# Patient Record
Sex: Male | Born: 1945
Health system: Southern US, Community
[De-identification: ages and names within clinical notes are randomized; demographics above are authoritative.]

## PROBLEM LIST (undated history)

## (undated) DIAGNOSIS — I10 Essential (primary) hypertension: Secondary | ICD-10-CM

## (undated) DIAGNOSIS — R001 Bradycardia, unspecified: Secondary | ICD-10-CM

## (undated) DIAGNOSIS — N189 Chronic kidney disease, unspecified: Secondary | ICD-10-CM

## (undated) HISTORY — PX: COLONOSCOPY: SHX174

## (undated) HISTORY — PX: HERNIA REPAIR: SHX51

---

## 2004-09-23 ENCOUNTER — Ambulatory Visit: Payer: Self-pay

## 2014-09-11 DIAGNOSIS — N183 Chronic kidney disease, stage 3 unspecified: Secondary | ICD-10-CM | POA: Insufficient documentation

## 2014-09-11 DIAGNOSIS — I10 Essential (primary) hypertension: Secondary | ICD-10-CM | POA: Insufficient documentation

## 2014-09-11 DIAGNOSIS — E669 Obesity, unspecified: Secondary | ICD-10-CM | POA: Insufficient documentation

## 2014-10-14 DIAGNOSIS — F5221 Male erectile disorder: Secondary | ICD-10-CM | POA: Insufficient documentation

## 2014-10-14 DIAGNOSIS — I10 Essential (primary) hypertension: Secondary | ICD-10-CM | POA: Insufficient documentation

## 2014-10-14 DIAGNOSIS — N289 Disorder of kidney and ureter, unspecified: Secondary | ICD-10-CM | POA: Insufficient documentation

## 2015-11-26 DIAGNOSIS — R200 Anesthesia of skin: Secondary | ICD-10-CM | POA: Insufficient documentation

## 2016-01-20 ENCOUNTER — Encounter: Admission: RE | Payer: Self-pay | Source: Ambulatory Visit

## 2016-01-20 ENCOUNTER — Ambulatory Visit: Admission: RE | Admit: 2016-01-20 | Payer: Self-pay | Source: Ambulatory Visit | Admitting: Gastroenterology

## 2016-01-20 SURGERY — COLONOSCOPY WITH PROPOFOL
Anesthesia: General

## 2016-12-28 ENCOUNTER — Encounter
Admission: RE | Disposition: A | Discharge status: Home / Self Care | Payer: Self-pay | Source: Ambulatory Visit | Attending: Gastroenterology

## 2016-12-28 ENCOUNTER — Ambulatory Visit
Admission: RE | Admit: 2016-12-28 | Discharge: 2016-12-28 | Disposition: A | Discharge status: Home / Self Care | Payer: Medicare Other | Source: Ambulatory Visit | Attending: Gastroenterology | Admitting: Gastroenterology

## 2016-12-28 ENCOUNTER — Ambulatory Visit: Payer: Medicare Other | Admitting: Anesthesiology

## 2016-12-28 ENCOUNTER — Encounter: Payer: Self-pay | Admitting: Anesthesiology

## 2016-12-28 DIAGNOSIS — K5730 Diverticulosis of large intestine without perforation or abscess without bleeding: Secondary | ICD-10-CM | POA: Insufficient documentation

## 2016-12-28 DIAGNOSIS — D631 Anemia in chronic kidney disease: Secondary | ICD-10-CM | POA: Insufficient documentation

## 2016-12-28 DIAGNOSIS — Z87891 Personal history of nicotine dependence: Secondary | ICD-10-CM | POA: Insufficient documentation

## 2016-12-28 DIAGNOSIS — K3189 Other diseases of stomach and duodenum: Secondary | ICD-10-CM | POA: Insufficient documentation

## 2016-12-28 DIAGNOSIS — D509 Iron deficiency anemia, unspecified: Secondary | ICD-10-CM | POA: Diagnosis not present

## 2016-12-28 DIAGNOSIS — K648 Other hemorrhoids: Secondary | ICD-10-CM | POA: Insufficient documentation

## 2016-12-28 DIAGNOSIS — K2960 Other gastritis without bleeding: Secondary | ICD-10-CM | POA: Diagnosis not present

## 2016-12-28 DIAGNOSIS — Z1211 Encounter for screening for malignant neoplasm of colon: Secondary | ICD-10-CM | POA: Insufficient documentation

## 2016-12-28 DIAGNOSIS — I129 Hypertensive chronic kidney disease with stage 1 through stage 4 chronic kidney disease, or unspecified chronic kidney disease: Secondary | ICD-10-CM | POA: Insufficient documentation

## 2016-12-28 DIAGNOSIS — K621 Rectal polyp: Secondary | ICD-10-CM | POA: Insufficient documentation

## 2016-12-28 DIAGNOSIS — D123 Benign neoplasm of transverse colon: Secondary | ICD-10-CM | POA: Diagnosis not present

## 2016-12-28 DIAGNOSIS — K228 Other specified diseases of esophagus: Secondary | ICD-10-CM | POA: Insufficient documentation

## 2016-12-28 DIAGNOSIS — K449 Diaphragmatic hernia without obstruction or gangrene: Secondary | ICD-10-CM | POA: Insufficient documentation

## 2016-12-28 DIAGNOSIS — N189 Chronic kidney disease, unspecified: Secondary | ICD-10-CM | POA: Diagnosis not present

## 2016-12-28 DIAGNOSIS — Z7982 Long term (current) use of aspirin: Secondary | ICD-10-CM | POA: Diagnosis not present

## 2016-12-28 DIAGNOSIS — K766 Portal hypertension: Secondary | ICD-10-CM | POA: Diagnosis not present

## 2016-12-28 HISTORY — DX: Bradycardia, unspecified: R00.1

## 2016-12-28 HISTORY — PX: ESOPHAGOGASTRODUODENOSCOPY (EGD) WITH PROPOFOL: SHX5813

## 2016-12-28 HISTORY — DX: Chronic kidney disease, unspecified: N18.9

## 2016-12-28 HISTORY — DX: Essential (primary) hypertension: I10

## 2016-12-28 HISTORY — PX: COLONOSCOPY: SHX5424

## 2016-12-28 LAB — CBC WITH DIFFERENTIAL/PLATELET
BASOS PCT: 1 %
Basophils Absolute: 0 10*3/uL (ref 0–0.1)
EOS ABS: 0 10*3/uL (ref 0–0.7)
EOS PCT: 1 %
HCT: 32.1 % — ABNORMAL LOW (ref 40.0–52.0)
Hemoglobin: 11 g/dL — ABNORMAL LOW (ref 13.0–18.0)
LYMPHS ABS: 1.2 10*3/uL (ref 1.0–3.6)
Lymphocytes Relative: 36 %
MCH: 34.3 pg — AB (ref 26.0–34.0)
MCHC: 34.4 g/dL (ref 32.0–36.0)
MCV: 99.8 fL (ref 80.0–100.0)
Monocytes Absolute: 0.3 10*3/uL (ref 0.2–1.0)
Monocytes Relative: 9 %
Neutro Abs: 1.8 10*3/uL (ref 1.4–6.5)
Neutrophils Relative %: 53 %
PLATELETS: 168 10*3/uL (ref 150–440)
RBC: 3.22 MIL/uL — AB (ref 4.40–5.90)
RDW: 14.3 % (ref 11.5–14.5)
WBC: 3.4 10*3/uL — AB (ref 3.8–10.6)

## 2016-12-28 LAB — PROTIME-INR
INR: 1.02
PROTHROMBIN TIME: 13.4 s (ref 11.4–15.2)

## 2016-12-28 SURGERY — COLONOSCOPY
Anesthesia: General

## 2016-12-28 MED ORDER — MIDAZOLAM HCL 5 MG/5ML IJ SOLN
INTRAMUSCULAR | Status: DC | PRN
  Administered 2016-12-28: 1 mg via INTRAVENOUS

## 2016-12-28 MED ORDER — LIDOCAINE HCL (PF) 2 % IJ SOLN
INTRAMUSCULAR | Status: AC
  Filled 2016-12-28: qty 2

## 2016-12-28 MED ORDER — PHENYLEPHRINE HCL 10 MG/ML IJ SOLN
INTRAMUSCULAR | Status: AC
  Filled 2016-12-28: qty 1

## 2016-12-28 MED ORDER — EPHEDRINE SULFATE 50 MG/ML IJ SOLN
INTRAMUSCULAR | Status: AC
  Filled 2016-12-28: qty 1

## 2016-12-28 MED ORDER — PROPOFOL 500 MG/50ML IV EMUL
INTRAVENOUS | Status: DC | PRN
  Administered 2016-12-28: 140 ug/kg/min via INTRAVENOUS

## 2016-12-28 MED ORDER — FENTANYL CITRATE (PF) 100 MCG/2ML IJ SOLN
INTRAMUSCULAR | Status: DC | PRN
Start: 2016-12-28 — End: 2016-12-28
  Administered 2016-12-28: 50 ug via INTRAVENOUS

## 2016-12-28 MED ORDER — PHENYLEPHRINE HCL 10 MG/ML IJ SOLN
INTRAMUSCULAR | Status: DC | PRN
  Administered 2016-12-28 (×2): 100 ug via INTRAVENOUS

## 2016-12-28 MED ORDER — PROPOFOL 10 MG/ML IV BOLUS
INTRAVENOUS | Status: DC | PRN
  Administered 2016-12-28: 100 mg via INTRAVENOUS

## 2016-12-28 MED ORDER — GLYCOPYRROLATE 0.2 MG/ML IJ SOLN
INTRAMUSCULAR | Status: DC | PRN
  Administered 2016-12-28: 0.2 mg via INTRAVENOUS

## 2016-12-28 MED ORDER — SODIUM CHLORIDE 0.9 % IV SOLN: INTRAVENOUS | Status: DC

## 2016-12-28 MED ORDER — PROPOFOL 10 MG/ML IV BOLUS
INTRAVENOUS | Status: AC
  Filled 2016-12-28: qty 20

## 2016-12-28 MED ORDER — LIDOCAINE 2% (20 MG/ML) 5 ML SYRINGE
INTRAMUSCULAR | Status: DC | PRN
  Administered 2016-12-28: 40 mg via INTRAVENOUS

## 2016-12-28 MED ORDER — FENTANYL CITRATE (PF) 100 MCG/2ML IJ SOLN
INTRAMUSCULAR | Status: AC
  Filled 2016-12-28: qty 2

## 2016-12-28 MED ORDER — PROPOFOL 500 MG/50ML IV EMUL
INTRAVENOUS | Status: AC
  Filled 2016-12-28: qty 50

## 2016-12-28 MED ORDER — SODIUM CHLORIDE 0.9 % IJ SOLN
INTRAMUSCULAR | Status: AC
  Filled 2016-12-28: qty 10

## 2016-12-28 MED ORDER — GLYCOPYRROLATE 0.2 MG/ML IJ SOLN
INTRAMUSCULAR | Status: AC
  Filled 2016-12-28: qty 1

## 2016-12-28 MED ORDER — MIDAZOLAM HCL 2 MG/2ML IJ SOLN
INTRAMUSCULAR | Status: AC
  Filled 2016-12-28: qty 2

## 2016-12-28 MED ORDER — EPHEDRINE SULFATE 50 MG/ML IJ SOLN
INTRAMUSCULAR | Status: DC | PRN
  Administered 2016-12-28: 10 mg via INTRAVENOUS

## 2016-12-28 MED ORDER — SODIUM CHLORIDE 0.9 % IV SOLN
INTRAVENOUS | Status: DC
  Administered 2016-12-28: 09:00:00 via INTRAVENOUS
  Administered 2016-12-28: 1000 mL via INTRAVENOUS

## 2016-12-28 NOTE — Op Note (Signed)
[facility] Gastroenterology Patient Name: Antonio Vance Procedure Date: 12/28/2016 8:45 AM MRN: [id number] Account #: [id number] Date of Birth: 22-Jul-1945 Admit Type: Outpatient Age: 71 Room: [facility] ENDO ROOM 3 Gender: Male Note Status: Finalized Procedure:            Colonoscopy Indications:          Screening for colorectal malignant neoplasm Providers:            Lollie Sails, MD Referring MD:         [name], MD (Referring MD) Medicines:            Monitored Anesthesia Care Complications:        No immediate complications. Procedure:            Pre-Anesthesia Assessment:                       - ASA Grade Assessment: III - A patient with severe                        systemic disease.                       After obtaining informed consent, the colonoscope was                        passed under direct vision. Throughout the procedure,                        the patient's blood pressure, pulse, and oxygen                        saturations were monitored continuously. The                        Colonoscope was introduced through the anus and                        advanced to the the cecum, identified by appendiceal                        orifice and ileocecal valve. The colonoscopy was                        performed without difficulty. The patient tolerated the                        procedure well. The quality of the bowel preparation                        was fair. Findings:      Multiple small-mouthed diverticula were found in the sigmoid colon and       descending colon.      A 3 mm polyp was found in the hepatic flexure. The polyp was flat. The       polyp was removed with a cold biopsy forceps. Resection and retrieval       were complete.      A 2 mm polyp was found in the rectum. The polyp was sessile. The polyp       was removed with a cold biopsy forceps. Resection and retrieval were  complete.      Non-bleeding internal  hemorrhoids were found during retroflexion. The       hemorrhoids were small and Grade I (internal hemorrhoids that do not       prolapse).      No additional abnormalities were found on retroflexion. Impression:           - Preparation of the colon was fair.                       - Diverticulosis in the sigmoid colon and in the                        descending colon.                       - One 3 mm polyp at the hepatic flexure, removed with a                        cold biopsy forceps. Resected and retrieved.                       - One 2 mm polyp in the rectum, removed with a cold                        biopsy forceps. Resected and retrieved.                       - Non-bleeding internal hemorrhoids. Recommendation:       - Discharge patient to home.                       - Telephone GI clinic for pathology results in 1 week.                       - Return to GI clinic in 4 weeks. Procedure Code(s):    --- Professional ---                       [phone], Colonoscopy, flexible; with biopsy, single or                        multiple Diagnosis Code(s):    --- Professional ---                       Z12.11, Encounter for screening for malignant neoplasm                        of colon                       K64.0, First degree hemorrhoids                       D12.3, Benign neoplasm of transverse colon (hepatic                        flexure or splenic flexure)                       K62.1, Rectal polyp  K57.30, Diverticulosis of large intestine without                        perforation or abscess without bleeding CPT copyright 2016 American Medical Association. All rights reserved. The codes documented in this report are preliminary and upon coder review may  be revised to meet current compliance requirements. Lollie Sails, MD 12/28/2016 9:28:56 AM This report has been signed electronically. Number of Addenda: 0 Note Initiated On: 12/28/2016 8:45 AM Scope  Withdrawal Time: 0 hours 9 minutes 14 seconds  Total Procedure Duration: 0 hours 16 minutes 6 seconds       [facility]

## 2016-12-28 NOTE — Anesthesia Preprocedure Evaluation (Signed)
Anesthesia Evaluation  Patient identified by MRN, date of birth, ID band Patient awake    Reviewed: Allergy & Precautions, H&P , NPO status , Patient's Chart, lab work & pertinent test results  History of Anesthesia Complications Negative for: history of anesthetic complications  Airway Mallampati: III  TM Distance: >3 FB Neck ROM: limited    Dental  (+) Poor Dentition, Missing, Upper Dentures, Lower Dentures   Pulmonary neg shortness of breath, former smoker,           Cardiovascular Exercise Tolerance: Good hypertension, + dysrhythmias      Neuro/Psych negative neurological ROS  negative psych ROS   GI/Hepatic negative GI ROS, Neg liver ROS,   Endo/Other  negative endocrine ROS  Renal/GU Renal disease  negative genitourinary   Musculoskeletal   Abdominal   Peds  Hematology negative hematology ROS (+)   Anesthesia Other Findings Past Medical History: No date: Bradycardia No date: Chronic kidney disease No date: Hypertension  Past Surgical History: No date: COLONOSCOPY No date: HERNIA REPAIR     Reproductive/Obstetrics negative OB ROS                             Anesthesia Physical Anesthesia Plan  ASA: III  Anesthesia Plan: General   Post-op Pain Management:    Induction: Intravenous  PONV Risk Score and Plan: 2 and Ondansetron and Dexamethasone  Airway Management Planned: Natural Airway and Nasal Cannula  Additional Equipment:   Intra-op Plan:   Post-operative Plan:   Informed Consent: I have reviewed the patients History and Physical, chart, labs and discussed the procedure including the risks, benefits and alternatives for the proposed anesthesia with the patient or authorized representative who has indicated his/her understanding and acceptance.   Dental Advisory Given  Plan Discussed with: Anesthesiologist, CRNA and Surgeon  Anesthesia Plan Comments:  (Patient consented for risks of anesthesia including but not limited to:  - adverse reactions to medications - damage to teeth, lips or other oral mucosa - sore throat or hoarseness - Damage to heart, brain, lungs or loss of life  Patient voiced understanding.)        Anesthesia Quick Evaluation

## 2016-12-28 NOTE — Anesthesia Post-op Follow-up Note (Cosign Needed)
Anesthesia QCDR form completed.        

## 2016-12-28 NOTE — Op Note (Signed)
[facility] Gastroenterology Patient Name: Antonio Vance Procedure Date: 12/28/2016 8:46 AM MRN: [id number] Account #: [id number] Date of Birth: 01/01/1946 Admit Type: Outpatient Age: 71 Room: [facility] ENDO ROOM 3 Gender: Male Note Status: Finalized Procedure:            Upper GI endoscopy Indications:          Iron deficiency anemia Providers:            Lollie Sails, MD Referring MD:         [name], MD (Referring MD) Medicines:            Monitored Anesthesia Care Complications:        No immediate complications. Procedure:            Pre-Anesthesia Assessment:                       - ASA Grade Assessment: III - A patient with severe                        systemic disease.                       After obtaining informed consent, the endoscope was                        passed under direct vision. Throughout the procedure,                        the patient's blood pressure, pulse, and oxygen                        saturations were monitored continuously. The Endoscope                        was introduced through the mouth, and advanced to the                        third part of duodenum. The upper GI endoscopy was                        accomplished without difficulty. The patient tolerated                        the procedure well. Findings:      The Z-line was irregular. Biopsies were taken with a cold forceps for       histology.      The exam of the esophagus was otherwise normal.      A small hiatal hernia was present.      Patchy moderate inflammation characterized by congestion (edema),       erosions and erythema was found in the gastric antrum. Biopsies were       taken with a cold forceps for histology. Biopsies were taken with a cold       forceps for Helicobacter pylori testing. Biopsies were taken with a cold       forceps for histology. Biopsies were taken with a cold forceps for       Helicobacter pylori testing.      Diffuse and  patchy mild inflammation characterized by congestion (edema)       and erythema was found in the gastric  body.      Mild portal hypertensive gastropathy was found in the gastric body.      no evidence of esophageal varices      The examined duodenum was normal. Impression:           - Z-line irregular. Biopsied.                       - Small hiatal hernia.                       - Erosive gastritis. Biopsied.                       - Gastritis.                       - Portal hypertensive gastropathy.                       - Normal examined duodenum. Recommendation:       - Await pathology results.                       - Use sucralfate tablets 1 gram PO BID daily.                       - Use Protonix (pantoprazole) 40 mg PO daily daily. Procedure Code(s):    --- Professional ---                       [phone], Esophagogastroduodenoscopy, flexible, transoral;                        with biopsy, single or multiple Diagnosis Code(s):    --- Professional ---                       K22.8, Other specified diseases of esophagus                       K44.9, Diaphragmatic hernia without obstruction or                        gangrene                       K29.60, Other gastritis without bleeding                       K29.70, Gastritis, unspecified, without bleeding                       K76.6, Portal hypertension                       K31.89, Other diseases of stomach and duodenum                       D50.9, Iron deficiency anemia, unspecified CPT copyright 2016 American Medical Association. All rights reserved. The codes documented in this report are preliminary and upon coder review may  be revised to meet current compliance requirements. Lollie Sails, MD 12/28/2016 9:06:38 AM This report has been signed electronically. Number of Addenda: 0 Note Initiated On: 12/28/2016 8:46 AM      [facility]

## 2016-12-28 NOTE — Transfer of Care (Signed)
Immediate Anesthesia Transfer of Care Note  Patient: Antonio Vance  Procedure(s) Performed: Procedure(s): COLONOSCOPY (N/A) ESOPHAGOGASTRODUODENOSCOPY (EGD) WITH PROPOFOL (N/A)  Patient Location: PACU and Endoscopy Unit  Anesthesia Type:General  Level of Consciousness: sedated  Airway & Oxygen Therapy: Patient Spontanous Breathing and Patient connected to nasal cannula oxygen  Post-op Assessment: Report given to RN and Post -op Vital signs reviewed and stable  Post vital signs: Reviewed and stable  Last Vitals:  Vitals:   12/28/16 0715  BP: (!) 156/54  Pulse: (!) 40  Resp: 16  Temp: 36.3 C    Last Pain:  Vitals:   12/28/16 0715  TempSrc: Tympanic         Complications: No apparent anesthesia complications

## 2016-12-28 NOTE — H&P (Signed)
Outpatient short stay form Pre-procedure 12/28/2016 7:55 AM Lollie Sails MD  Primary Physician: Dr. Delight Stare  Reason for visit:  EGD and colonoscopy  History of present illness:  Patient is a 71 year old male presenting today as above. He has a history of macrocytic anemia. He apparently has had a colonoscopy in the past but does not remember it. In evaluation of his anemia there seems to be a mixed etiology with contributions from chronic kidney disease, possibly B12 or folate deficiency, as well as alcohol use. Patient also does take NSAIDs.    Current Facility-Administered Medications:  .  0.9 %  sodium chloride infusion, , Intravenous, Continuous, Lollie Sails, MD, Last Rate: 20 mL/hr at 12/28/16 0733, 1,000 mL at 12/28/16 0733 .  0.9 %  sodium chloride infusion, , Intravenous, Continuous, Lollie Sails, MD  Prescriptions Prior to Admission  Medication Sig Dispense Refill Last Dose  . amlodipine-atorvastatin (CADUET) 10-10 MG tablet Take 1 tablet by mouth daily.   12/27/2016 at Unknown time  . lisinopril (PRINIVIL,ZESTRIL) 10 MG tablet Take 10 mg by mouth daily.   12/27/2016 at Unknown time  . Multiple Vitamin (MULTIVITAMIN) tablet Take 1 tablet by mouth daily.   Past Week at Unknown time  . naproxen sodium (ANAPROX) 220 MG tablet Take 220 mg by mouth daily as needed.     Marland Kitchen aspirin 81 MG chewable tablet Chew by mouth daily.   Not Taking at Unknown time     No Known Allergies   Past Medical History:  Diagnosis Date  . Bradycardia   . Chronic kidney disease   . Hypertension     Review of systems:      Physical Exam    Heart and lungs: Irregularly irregular    HEENT: Normocephalic atraumatic eyes are anicteric    Other:     Pertinant exam for procedure: Soft nontender nondistended bowel sounds positive normoactive.    Planned proceedures: EGD, colonoscopy and indicated procedures. I have discussed the risks benefits and complications of procedures  to include not limited to bleeding, infection, perforation and the risk of sedation and the patient wishes to proceed.    Lollie Sails, MD Gastroenterology 12/28/2016  7:55 AM

## 2016-12-29 ENCOUNTER — Encounter: Payer: Self-pay | Admitting: Gastroenterology

## 2016-12-29 LAB — SURGICAL PATHOLOGY

## 2016-12-29 NOTE — Anesthesia Postprocedure Evaluation (Signed)
Anesthesia Post Note  Patient: Antonio Vance  Procedure(s) Performed: Procedure(s) (LRB): COLONOSCOPY (N/A) ESOPHAGOGASTRODUODENOSCOPY (EGD) WITH PROPOFOL (N/A)  Patient location during evaluation: Endoscopy Anesthesia Type: General Level of consciousness: awake and alert Pain management: pain level controlled Vital Signs Assessment: post-procedure vital signs reviewed and stable Respiratory status: spontaneous breathing, nonlabored ventilation, respiratory function stable and patient connected to nasal cannula oxygen Cardiovascular status: blood pressure returned to baseline and stable Postop Assessment: no signs of nausea or vomiting Anesthetic complications: no     Last Vitals:  Vitals:   12/28/16 0950 12/28/16 1000  BP: 129/86 (!) 154/77  Pulse: 64 (!) 36  Resp: 15 20  Temp:      Last Pain:  Vitals:   12/29/16 0752  TempSrc:   PainSc: 0-No pain                 Precious Haws Demauri Advincula

## 2017-06-24 ENCOUNTER — Ambulatory Visit: Payer: Self-pay | Admitting: Urology

## 2017-07-22 ENCOUNTER — Encounter: Payer: Self-pay | Admitting: Urology

## 2017-07-22 ENCOUNTER — Other Ambulatory Visit
Admission: RE | Admit: 2017-07-22 | Discharge: 2017-07-22 | Disposition: A | Discharge status: Home / Self Care | Payer: Medicare Other | Source: Ambulatory Visit | Attending: Urology | Admitting: Urology

## 2017-07-22 ENCOUNTER — Ambulatory Visit (INDEPENDENT_AMBULATORY_CARE_PROVIDER_SITE_OTHER): Payer: Medicare Other | Admitting: Urology

## 2017-07-22 VITALS — BP 148/57 | HR 40 | Ht 69.0 in | Wt 215.0 lb

## 2017-07-22 DIAGNOSIS — R972 Elevated prostate specific antigen [PSA]: Secondary | ICD-10-CM | POA: Diagnosis not present

## 2017-07-22 DIAGNOSIS — C61 Malignant neoplasm of prostate: Secondary | ICD-10-CM | POA: Diagnosis present

## 2017-07-22 NOTE — Progress Notes (Signed)
07/22/2017 3:26 PM   Antonio Vance 01-02-1946 [id number]  Referring provider: [name], [address] [place], [name] [address]  Chief Complaint  Patient presents with  . Elevated PSA    New Patient    HPI: 72 year old male referred for further evaluation of elevated PSA.  He underwent routine by his PCP at [facility].  This revealed an elevated PSA to 5.5 in 05/2017.  No other PSAs were sent for review.  He does not recall ever being told he had elevated PSA in the past.  He has not had any recent rectal exams.  He has no family history of prostate cancer.  He denies any significant urinary symptoms including no hesitancy, weak stream, incomplete bladder emptying.  He has no frequency or urgency.  No UTIs.  He gets up 1-2 times at night but this is not bothersome to him.  He is not on any BPH medications.  No weight loss or bone pain.   PMH: Past Medical History:  Diagnosis Date  . Bradycardia   . Chronic kidney disease   . Hypertension     Surgical History: Past Surgical History:  Procedure Laterality Date  . COLONOSCOPY    . COLONOSCOPY N/A 12/28/2016   Procedure: COLONOSCOPY;  Surgeon: Lollie Sails, MD;  Location: [facility] ENDOSCOPY;  Service: Endoscopy;  Laterality: N/A;  . ESOPHAGOGASTRODUODENOSCOPY (EGD) WITH PROPOFOL N/A 12/28/2016   Procedure: ESOPHAGOGASTRODUODENOSCOPY (EGD) WITH PROPOFOL;  Surgeon: Lollie Sails, MD;  Location: [facility] ENDOSCOPY;  Service: Endoscopy;  Laterality: N/A;  . HERNIA REPAIR      Home Medications:  Allergies as of 07/22/2017   No Known Allergies     Medication List        Accurate as of 07/22/17  3:26 PM. Always use your most recent med list.          amLODipine 10 MG tablet Commonly known as:  NORVASC Take by mouth.   aspirin 81 MG chewable tablet Chew by mouth daily.   lisinopril 10 MG tablet Commonly known as:  PRINIVIL,ZESTRIL Take 10 mg by mouth daily.   multivitamin tablet Take 1  tablet by mouth daily.       Allergies: No Known Allergies  Family History: Family History  Problem Relation Age of Onset  . Hypertension Mother   . Cancer Mother   . Hypertension Father   . Diabetes Brother   . Hypertension Brother     Social History:  reports that he has quit smoking. he has never used smokeless tobacco. He reports that he does not drink alcohol or use drugs.  ROS: UROLOGY Frequent Urination?: No Hard to postpone urination?: No Burning/pain with urination?: No Get up at night to urinate?: No Leakage of urine?: No Urine stream starts and stops?: No Trouble starting stream?: No Do you have to strain to urinate?: No Blood in urine?: No Urinary tract infection?: No Sexually transmitted disease?: No Injury to kidneys or bladder?: No Painful intercourse?: No Weak stream?: No Erection problems?: No Penile pain?: No  Gastrointestinal Nausea?: No Vomiting?: No Indigestion/heartburn?: No Diarrhea?: No Constipation?: No  Constitutional Fever: No Night sweats?: No Weight loss?: No Fatigue?: No  Skin Skin rash/lesions?: No Itching?: No  Eyes Blurred vision?: No Double vision?: No  Ears/Nose/Throat Sore throat?: No Sinus problems?: No  Hematologic/Lymphatic Swollen glands?: No Easy bruising?: No  Cardiovascular Leg swelling?: No Chest pain?: No  Respiratory Cough?: No Shortness of breath?: No  Endocrine Excessive thirst?: No  Musculoskeletal Back pain?: No Joint pain?: Yes  Neurological Headaches?: No Dizziness?: No  Psychologic Depression?: No Anxiety?: No  Physical Exam: BP (!) 148/57   Pulse (!) 40   Ht 5\' 9"  (1.753 m)   Wt 215 lb (97.5 kg)   BMI 31.75 kg/m   Constitutional:  Alert and oriented, No acute distress.  Pleasant.  Accompanied by daughter today. HEENT: [place] AT, moist mucus membranes.  Trachea midline, no masses. Cardiovascular: No clubbing, cyanosis, or edema. Respiratory: Normal respiratory effort,  no increased work of breathing. GI: Abdomen is soft, nontender, nondistended, no abdominal masses GU: No CVA tenderness.  DRE: Normal sphincter tone.  Enlarged 50+ cc prostate, nontender, nondistedned.   Skin: No rashes, bruises or suspicious lesions. Neurologic: Grossly intact, no focal deficits, moving all 4 extremities. Psychiatric: Normal mood and affect.  Laboratory Data: Lab Results  Component Value Date   WBC 3.4 (L) 12/28/2016   HGB 11.0 (L) 12/28/2016   HCT 32.1 (L) 12/28/2016   MCV 99.8 12/28/2016   PLT 168 12/28/2016    Creatinine 1.87.   PSA 5.5.  On 05/2017  Urinalysis N/a   Pertinent Imaging: N/A  Assessment & Plan:    1. Prostate cancer [facility])  We reviewed the implications of an elevated PSA and the uncertainty surrounding it. In general, a man's PSA increases with age and is produced by both normal and cancerous prostate tissue. Differential for elevated PSA is BPH, prostate cancer, infection, recent intercourse/ejaculation, prostate infarction, recent urethroscopic manipulation (foley placement/cystoscopy) and prostatitis. Management of an elevated PSA can include observation or prostate biopsy and wediscussed this in detail.  We discussed that indications for prostate biopsy are defined by age and race specific PSA cutoffs as well as a PSA velocity of 0.75/year.  We reached out to [facility] clinic clinic today.  Unfortunately, their office is closed this afternoon and were unable to obtain previous PSAs for review.  I explained without knowing what his previous PSAs have been, it is difficult to assess whether or not this is an acute elevation or perhaps his baseline.  Based on his exam today, he does have an enlarged prostate without significant urinary symptoms and thus his PSA density may be appropriate.  PSA was repeated today.  If his PSA is stable or more elevated, will repeat in 4 months.  If his PSA goes down to normal, will follow him next year with  a repeat PSA DRE.  Both he and his daughter are agreeable to this plan.  His daughter is a Marine scientist.  She would like to be the primary contact for all medical communications.  We will also attempt to get his previous PSA data from [facility] clinic on Monday.  - PSA; Future  Return in about 4 months (around 11/19/2017) for PSA.  Hollice Espy, MD  [facility] [address] [place], [place] [address] [phone]  * Call daughter with PSA results as primary contact

## 2017-07-23 LAB — PSA: PROSTATIC SPECIFIC ANTIGEN: 6.44 ng/mL — AB (ref 0.00–4.00)

## 2017-07-25 ENCOUNTER — Telehealth: Payer: Self-pay | Admitting: Urology

## 2017-07-25 NOTE — Telephone Encounter (Signed)
Called and the office is closed for [place] will call tomorrow to get his labs  [place]

## 2017-07-25 NOTE — Telephone Encounter (Signed)
-----   Message from Hollice Espy, MD sent at 07/22/2017  5:42 PM EST ----- Could you please request additional PSA records from [facility] clinic?  Will be called on Friday afternoon, they were closed.

## 2017-07-26 ENCOUNTER — Other Ambulatory Visit: Payer: Self-pay

## 2017-08-15 ENCOUNTER — Telehealth: Payer: Self-pay | Admitting: Urology

## 2017-08-15 NOTE — Telephone Encounter (Signed)
Patient's previous PSA results were received from [facility].  The results were 5.5 on 05/06/2017.  Patient's most recent PSA in our office were 6.44 on 07/22/2017.  Patient's daughter, Ortencia Kick, called the office today inquiring if a follow up on 11/25/2017 is still appropriate based on his increasing PSA.  She can be reached at [phone]

## 2017-08-16 NOTE — Telephone Encounter (Signed)
Spoke with Anderson Malta in reference to pt f/u in May and PSA results from [facility]. Anderson Malta voiced understanding of whole conversation.

## 2017-08-16 NOTE — Telephone Encounter (Signed)
Yes, lets keep f/u in 11/2017 with PSA a few days prior to the visit.    At the time of consult in 07/2017, I had these values and was looking for additional values which are not available.    Lets keep our f/u as scheduled.  I would like to get a least one more data point before we make decision to biopsy.    Hollice Espy, MD

## 2017-08-24 ENCOUNTER — Other Ambulatory Visit: Payer: Self-pay | Admitting: Family Medicine

## 2017-08-24 DIAGNOSIS — R413 Other amnesia: Secondary | ICD-10-CM

## 2017-09-02 ENCOUNTER — Ambulatory Visit: Payer: Medicare Other

## 2017-11-15 ENCOUNTER — Other Ambulatory Visit: Payer: Self-pay

## 2017-11-24 ENCOUNTER — Other Ambulatory Visit: Payer: Self-pay

## 2017-11-24 DIAGNOSIS — R972 Elevated prostate specific antigen [PSA]: Secondary | ICD-10-CM

## 2017-11-25 ENCOUNTER — Encounter: Payer: Self-pay | Admitting: Urology

## 2017-11-25 ENCOUNTER — Ambulatory Visit: Payer: Medicare Other | Admitting: Urology

## 2017-11-25 ENCOUNTER — Ambulatory Visit (INDEPENDENT_AMBULATORY_CARE_PROVIDER_SITE_OTHER): Payer: Medicare Other | Admitting: Urology

## 2017-11-25 VITALS — BP 133/65 | HR 40 | Resp 16 | Ht 68.5 in | Wt 209.5 lb

## 2017-11-25 DIAGNOSIS — R972 Elevated prostate specific antigen [PSA]: Secondary | ICD-10-CM | POA: Diagnosis not present

## 2017-11-29 NOTE — Progress Notes (Signed)
11/25/2017 10:26 AM   Antonio Vance 09-28-1945 [id number]  Referring provider: [name], [place], [place] [address]  Chief Complaint  Patient presents with  . Follow-up    elevated PSA    HPI: 72 year old male who returns today for follow-up of elevated PSA.  He underwent routine by his PCP at [facility] clinic.  This revealed an elevated PSA to 5.5 in 05/2017.   PSA was repeated in 07/22/2017 and noted to be rising to 6.44.  Most recently, he had a talk to his primary care physicians on 11/11/2017 at which time his PSA was 6.2.  Prior to the above, he has no previous PSAs for review.  Rectal exam in 07/2017 showed enlarged 50+ cc prostate, without nodules.  He has no family history of prostate cancer.  He denies any significant urinary symptoms including no hesitancy, weak stream, incomplete bladder emptying.  He has no frequency or urgency.  No UTIs.  He gets up 1-2 times at night but this is not bothersome to him.  He is not on any BPH medications.  His urinary symptoms today are unchanged.    PMH: Past Medical History:  Diagnosis Date  . Bradycardia   . Chronic kidney disease   . Hypertension     Surgical History: Past Surgical History:  Procedure Laterality Date  . COLONOSCOPY    . COLONOSCOPY N/A 12/28/2016   Procedure: COLONOSCOPY;  Surgeon: Lollie Sails, MD;  Location: [facility] ENDOSCOPY;  Service: Endoscopy;  Laterality: N/A;  . ESOPHAGOGASTRODUODENOSCOPY (EGD) WITH PROPOFOL N/A 12/28/2016   Procedure: ESOPHAGOGASTRODUODENOSCOPY (EGD) WITH PROPOFOL;  Surgeon: Lollie Sails, MD;  Location: [facility] ENDOSCOPY;  Service: Endoscopy;  Laterality: N/A;  . HERNIA REPAIR      Home Medications:  Allergies as of 11/25/2017   No Known Allergies     Medication List        Accurate as of 11/25/17 11:59 PM. Always use your most recent med list.          amLODipine 10 MG tablet Commonly known as:  NORVASC Take by mouth.   aspirin  81 MG chewable tablet Chew by mouth daily.   b complex vitamins capsule Take by mouth.   lisinopril 10 MG tablet Commonly known as:  PRINIVIL,ZESTRIL Take 10 mg by mouth daily.   multivitamin tablet Take 1 tablet by mouth daily.       Allergies: No Known Allergies  Family History: Family History  Problem Relation Age of Onset  . Hypertension Mother   . Cancer Mother   . Hypertension Father   . Diabetes Brother   . Hypertension Brother     Social History:  reports that he has quit smoking. He has never used smokeless tobacco. He reports that he does not drink alcohol or use drugs.  ROS: UROLOGY Frequent Urination?: No Hard to postpone urination?: No Burning/pain with urination?: No Get up at night to urinate?: Yes Leakage of urine?: No Urine stream starts and stops?: No Trouble starting stream?: No Do you have to strain to urinate?: No Blood in urine?: No Urinary tract infection?: No Sexually transmitted disease?: No Injury to kidneys or bladder?: No Painful intercourse?: No Weak stream?: No Erection problems?: No Penile pain?: No  Gastrointestinal Nausea?: No Vomiting?: No Indigestion/heartburn?: No Diarrhea?: No Constipation?: No  Constitutional Fever: No Night sweats?: No Weight loss?: No Fatigue?: No  Skin Skin rash/lesions?: No Itching?: No  Eyes Blurred vision?: No Double vision?: No  Ears/Nose/Throat Sore throat?: No Sinus problems?: No  Hematologic/Lymphatic Swollen glands?: No Easy bruising?: No  Cardiovascular Leg swelling?: No Chest pain?: No  Respiratory Cough?: No Shortness of breath?: No  Endocrine Excessive thirst?: No  Musculoskeletal Back pain?: No Joint pain?: No  Neurological Headaches?: No Dizziness?: No  Psychologic Depression?: No Anxiety?: No  Physical Exam: BP 133/65   Pulse (!) 40   Resp 16   Ht 5' 8.5" (1.74 m)   Wt 209 lb 8 oz (95 kg)   SpO2 96%   BMI 31.39 kg/m   Constitutional:   Alert and oriented, No acute distress.  Accompanied by daughter today. HEENT: [place] AT, moist mucus membranes.  Trachea midline, no masses. Cardiovascular: No clubbing, cyanosis, or edema. Respiratory: Normal respiratory effort, no increased work of breathing. Skin: No rashes, bruises or suspicious lesions. Neurologic: Grossly intact, no focal deficits, moving all 4 extremities. Psychiatric: Normal mood and affect.  Laboratory Data: Lab Results  Component Value Date   WBC 3.4 (L) 12/28/2016   HGB 11.0 (L) 12/28/2016   HCT 32.1 (L) 12/28/2016   MCV 99.8 12/28/2016   PLT 168 12/28/2016   PSA trend  as above  Urinalysis N/A  Pertinent Imaging: NA  Assessment & Plan:    1. Elevated PSA History of elevated and mildly fluctuating PSA in the setting of prostamegaly As previously discussed, baseline PSA is unknown and this may in fact represent his baseline PSA which may be appropriate for the size of his gland We will continue to follow conservatively at this time given his age and comorbidities Plan for PSA/DRE in 6 months Patient and his daughter are agreeable this plan  Return in about 6 months (around 05/28/2018) for PSA/ DRE.  Hollice Espy, MD  [facility] [address] [place], [place] [address] [phone]

## 2018-04-07 ENCOUNTER — Emergency Department: Payer: Medicare Other

## 2018-04-07 ENCOUNTER — Encounter: Payer: Self-pay | Admitting: Emergency Medicine

## 2018-04-07 ENCOUNTER — Other Ambulatory Visit: Payer: Self-pay

## 2018-04-07 ENCOUNTER — Observation Stay
Admission: EM | Admit: 2018-04-07 | Discharge: 2018-04-08 | Disposition: A | Discharge status: Home / Self Care | Payer: Medicare Other | Attending: Internal Medicine | Admitting: Internal Medicine

## 2018-04-07 DIAGNOSIS — N400 Enlarged prostate without lower urinary tract symptoms: Secondary | ICD-10-CM | POA: Insufficient documentation

## 2018-04-07 DIAGNOSIS — N183 Chronic kidney disease, stage 3 (moderate): Secondary | ICD-10-CM | POA: Diagnosis not present

## 2018-04-07 DIAGNOSIS — M5136 Other intervertebral disc degeneration, lumbar region: Secondary | ICD-10-CM | POA: Diagnosis not present

## 2018-04-07 DIAGNOSIS — I1310 Hypertensive heart and chronic kidney disease without heart failure, with stage 1 through stage 4 chronic kidney disease, or unspecified chronic kidney disease: Secondary | ICD-10-CM | POA: Insufficient documentation

## 2018-04-07 DIAGNOSIS — I313 Pericardial effusion (noninflammatory): Secondary | ICD-10-CM | POA: Insufficient documentation

## 2018-04-07 DIAGNOSIS — K439 Ventral hernia without obstruction or gangrene: Secondary | ICD-10-CM | POA: Diagnosis not present

## 2018-04-07 DIAGNOSIS — I712 Thoracic aortic aneurysm, without rupture: Secondary | ICD-10-CM | POA: Diagnosis not present

## 2018-04-07 DIAGNOSIS — K5730 Diverticulosis of large intestine without perforation or abscess without bleeding: Secondary | ICD-10-CM | POA: Insufficient documentation

## 2018-04-07 DIAGNOSIS — K2970 Gastritis, unspecified, without bleeding: Principal | ICD-10-CM | POA: Insufficient documentation

## 2018-04-07 DIAGNOSIS — R7989 Other specified abnormal findings of blood chemistry: Secondary | ICD-10-CM | POA: Insufficient documentation

## 2018-04-07 DIAGNOSIS — Z7982 Long term (current) use of aspirin: Secondary | ICD-10-CM | POA: Diagnosis not present

## 2018-04-07 DIAGNOSIS — K449 Diaphragmatic hernia without obstruction or gangrene: Secondary | ICD-10-CM | POA: Diagnosis not present

## 2018-04-07 DIAGNOSIS — Z87891 Personal history of nicotine dependence: Secondary | ICD-10-CM | POA: Insufficient documentation

## 2018-04-07 DIAGNOSIS — K429 Umbilical hernia without obstruction or gangrene: Secondary | ICD-10-CM | POA: Diagnosis not present

## 2018-04-07 DIAGNOSIS — I214 Non-ST elevation (NSTEMI) myocardial infarction: Secondary | ICD-10-CM | POA: Diagnosis present

## 2018-04-07 DIAGNOSIS — R778 Other specified abnormalities of plasma proteins: Secondary | ICD-10-CM | POA: Diagnosis present

## 2018-04-07 DIAGNOSIS — Z79899 Other long term (current) drug therapy: Secondary | ICD-10-CM | POA: Insufficient documentation

## 2018-04-07 LAB — URINALYSIS, COMPLETE (UACMP) WITH MICROSCOPIC
Bacteria, UA: NONE SEEN
Bilirubin Urine: NEGATIVE
Glucose, UA: NEGATIVE mg/dL
Hgb urine dipstick: NEGATIVE
KETONES UR: NEGATIVE mg/dL
Leukocytes, UA: NEGATIVE
Nitrite: NEGATIVE
PH: 5 (ref 5.0–8.0)
Protein, ur: NEGATIVE mg/dL
SPECIFIC GRAVITY, URINE: 1.017 (ref 1.005–1.030)

## 2018-04-07 LAB — COMPREHENSIVE METABOLIC PANEL
ALBUMIN: 3.7 g/dL (ref 3.5–5.0)
ALK PHOS: 75 U/L (ref 38–126)
ALT: 11 U/L (ref 0–44)
AST: 14 U/L — AB (ref 15–41)
Anion gap: 7 (ref 5–15)
BUN: 31 mg/dL — AB (ref 8–23)
CALCIUM: 9.4 mg/dL (ref 8.9–10.3)
CHLORIDE: 111 mmol/L (ref 98–111)
CO2: 24 mmol/L (ref 22–32)
CREATININE: 1.69 mg/dL — AB (ref 0.61–1.24)
GFR calc Af Amer: 45 mL/min — ABNORMAL LOW (ref >60)
GFR calc non Af Amer: 38 mL/min — ABNORMAL LOW (ref >60)
Glucose, Bld: 94 mg/dL (ref 70–99)
Potassium: 4.4 mmol/L (ref 3.5–5.1)
SODIUM: 142 mmol/L (ref 135–145)
Total Bilirubin: 0.9 mg/dL (ref 0.3–1.2)
Total Protein: 7.7 g/dL (ref 6.5–8.1)

## 2018-04-07 LAB — LIPASE, BLOOD: Lipase: 29 U/L (ref 11–51)

## 2018-04-07 LAB — CBC
HCT: 32.7 % — ABNORMAL LOW (ref 40.0–52.0)
Hemoglobin: 11.5 g/dL — ABNORMAL LOW (ref 13.0–18.0)
MCH: 35.4 pg — AB (ref 26.0–34.0)
MCHC: 35.4 g/dL (ref 32.0–36.0)
MCV: 100.1 fL — AB (ref 80.0–100.0)
PLATELETS: 173 10*3/uL (ref 150–440)
RBC: 3.26 MIL/uL — ABNORMAL LOW (ref 4.40–5.90)
RDW: 14.2 % (ref 11.5–14.5)
WBC: 3.9 10*3/uL (ref 3.8–10.6)

## 2018-04-07 LAB — TSH: TSH: 1.982 u[IU]/mL (ref 0.350–4.500)

## 2018-04-07 LAB — TROPONIN I
Troponin I: 0.11 ng/mL (ref <0.03)
Troponin I: 0.11 ng/mL (ref <0.03)

## 2018-04-07 LAB — CK: CK TOTAL: 123 U/L (ref 49–397)

## 2018-04-07 MED ORDER — ASPIRIN EC 81 MG PO TBEC
81.0000 mg | DELAYED_RELEASE_TABLET | Freq: Every day | ORAL | Status: DC
  Administered 2018-04-07 – 2018-04-08 (×2): 81 mg via ORAL
  Filled 2018-04-07 (×2): qty 1

## 2018-04-07 MED ORDER — SODIUM CHLORIDE 0.9% FLUSH
3.0000 mL | Freq: Two times a day (BID) | INTRAVENOUS | Status: DC
  Administered 2018-04-07 – 2018-04-08 (×2): 3 mL via INTRAVENOUS

## 2018-04-07 MED ORDER — HYDROCODONE-ACETAMINOPHEN 5-325 MG PO TABS
1.0000 | ORAL_TABLET | ORAL | Status: DC | PRN
  Administered 2018-04-07: 2 via ORAL
  Filled 2018-04-07: qty 2

## 2018-04-07 MED ORDER — AMLODIPINE BESYLATE 10 MG PO TABS
10.0000 mg | ORAL_TABLET | Freq: Every day | ORAL | Status: DC
  Administered 2018-04-07 – 2018-04-08 (×2): 10 mg via ORAL
  Filled 2018-04-07 (×2): qty 1

## 2018-04-07 MED ORDER — HYDROCODONE-ACETAMINOPHEN 5-325 MG PO TABS: 1.0000 | ORAL_TABLET | ORAL | Status: DC | PRN

## 2018-04-07 MED ORDER — PANTOPRAZOLE SODIUM 40 MG PO TBEC
40.0000 mg | DELAYED_RELEASE_TABLET | Freq: Two times a day (BID) | ORAL | Status: DC
  Administered 2018-04-07 – 2018-04-08 (×2): 40 mg via ORAL
  Filled 2018-04-07 (×2): qty 1

## 2018-04-07 MED ORDER — ACETAMINOPHEN 650 MG RE SUPP: 650.0000 mg | Freq: Four times a day (QID) | RECTAL | Status: DC | PRN

## 2018-04-07 MED ORDER — ONDANSETRON HCL 4 MG PO TABS: 4.0000 mg | ORAL_TABLET | Freq: Four times a day (QID) | ORAL | Status: DC | PRN

## 2018-04-07 MED ORDER — ONDANSETRON HCL 4 MG/2ML IJ SOLN: 4.0000 mg | Freq: Four times a day (QID) | INTRAMUSCULAR | Status: DC | PRN

## 2018-04-07 MED ORDER — LISINOPRIL 10 MG PO TABS
10.0000 mg | ORAL_TABLET | Freq: Every day | ORAL | Status: DC
  Administered 2018-04-07 – 2018-04-08 (×2): 10 mg via ORAL
  Filled 2018-04-07 (×2): qty 1

## 2018-04-07 MED ORDER — ACETAMINOPHEN 325 MG PO TABS: 650.0000 mg | ORAL_TABLET | Freq: Four times a day (QID) | ORAL | Status: DC | PRN

## 2018-04-07 MED ORDER — POLYETHYLENE GLYCOL 3350 17 G PO PACK: 17.0000 g | PACK | Freq: Every day | ORAL | Status: DC | PRN

## 2018-04-07 MED ORDER — IOHEXOL 350 MG/ML SOLN
75.0000 mL | Freq: Once | INTRAVENOUS | Status: AC | PRN
  Administered 2018-04-07: 75 mL via INTRAVENOUS

## 2018-04-07 MED ORDER — ENOXAPARIN SODIUM 40 MG/0.4ML ~~LOC~~ SOLN
40.0000 mg | SUBCUTANEOUS | Status: DC
  Administered 2018-04-07: 40 mg via SUBCUTANEOUS
  Filled 2018-04-07: qty 0.4

## 2018-04-07 MED ORDER — ALBUTEROL SULFATE (2.5 MG/3ML) 0.083% IN NEBU: 2.5000 mg | INHALATION_SOLUTION | RESPIRATORY_TRACT | Status: DC | PRN

## 2018-04-07 MED ORDER — TRAMADOL HCL 50 MG PO TABS: 50.0000 mg | ORAL_TABLET | Freq: Four times a day (QID) | ORAL | Status: DC | PRN

## 2018-04-07 NOTE — ED Notes (Signed)
Pt taken to CT.

## 2018-04-07 NOTE — ED Notes (Signed)
Pt talking to family member on the phone.

## 2018-04-07 NOTE — ED Notes (Signed)
Pt ambulatory to toilet by self. No distress noted.

## 2018-04-07 NOTE — ED Notes (Addendum)
Pt states R sided abd pain that wraps around R flank when moving around, only when moving around. States pain x 1 month. Normal BM per pt. No N&V.   Alert, oriented, ambulatory. No distress noted.

## 2018-04-07 NOTE — ED Notes (Signed)
Date and time results received: 04/07/18 1253   Test: tropinin Critical Value: 0.11  Name of Provider Notified: Dr. Alfred Levins

## 2018-04-07 NOTE — ED Notes (Signed)
Pt in CT.

## 2018-04-07 NOTE — ED Notes (Signed)
Pt ambulatory to treatment room. Attempting to obtain urine sample at this time.

## 2018-04-07 NOTE — ED Provider Notes (Signed)
[facility] Emergency Department Provider Note  ____________________________________________  Time seen: Approximately 11:30 AM  I have reviewed the triage vital signs and the nursing notes.   HISTORY  Chief Complaint Abdominal Pain   HPI Antonio Vance is a 72 y.o. male with a history of chronic kidney disease and hypertension who presents for evaluation of right-sided abdominal pain.  Patient reports right lower quadrant abdominal pain radiating to the right flank which has been present for 4 weeks.  The pain resolves at rest and happens with movement of his torso.  The pain is nonreproducible on palpation.  Patient does not remember what he was doing when the pain started.  He initially saw his primary care doctor who gave him a pain pill but that did not help.  He denies constipation, diarrhea, dysuria, hematuria, chest pain, shortness of breath, fever, chills.  The pain is not pleuritic in nature.  Past Medical History:  Diagnosis Date  . Bradycardia   . Chronic kidney disease   . Hypertension     Patient Active Problem List   Diagnosis Date Noted  . Bilateral hand numbness 11/26/2015  . Kidney disease 10/14/2014  . Erectile dysfunction of nonorganic origin 10/14/2014  . Benign essential hypertension 10/14/2014  . Obesity (BMI 30-39.9) 09/11/2014  . Hypertension 09/11/2014  . CKD (chronic kidney disease) stage 3, GFR 30-59 ml/min (HCC) 09/11/2014    Past Surgical History:  Procedure Laterality Date  . COLONOSCOPY    . COLONOSCOPY N/A 12/28/2016   Procedure: COLONOSCOPY;  Surgeon: Lollie Sails, MD;  Location: [facility] ENDOSCOPY;  Service: Endoscopy;  Laterality: N/A;  . ESOPHAGOGASTRODUODENOSCOPY (EGD) WITH PROPOFOL N/A 12/28/2016   Procedure: ESOPHAGOGASTRODUODENOSCOPY (EGD) WITH PROPOFOL;  Surgeon: Lollie Sails, MD;  Location: [facility] ENDOSCOPY;  Service: Endoscopy;  Laterality: N/A;  . HERNIA REPAIR      Prior to Admission medications     Medication Sig Start Date End Date Taking? Authorizing Provider  amLODipine (NORVASC) 10 MG tablet Take by mouth. 09/11/14   [provider]  aspirin 81 MG chewable tablet Chew by mouth daily.    [provider]  b complex vitamins capsule Take by mouth.    [provider]  lisinopril (PRINIVIL,ZESTRIL) 10 MG tablet Take 10 mg by mouth daily.    [provider]  Multiple Vitamin (MULTIVITAMIN) tablet Take 1 tablet by mouth daily.    [provider]    Allergies Patient has no known allergies.  Family History  Problem Relation Age of Onset  . Hypertension Mother   . Cancer Mother   . Hypertension Father   . Diabetes Brother   . Hypertension Brother     Social History Social History   Tobacco Use  . Smoking status: Former Research scientist (life sciences)  . Smokeless tobacco: Never Used  Substance Use Topics  . Alcohol use: No  . Drug use: No    Review of Systems  Constitutional: Negative for fever. Eyes: Negative for visual changes. ENT: Negative for sore throat. Neck: No neck pain  Cardiovascular: Negative for chest pain. Respiratory: Negative for shortness of breath. Gastrointestinal: + R sided abdominal pain. No vomiting or diarrhea. Genitourinary: Negative for dysuria. + R flank pain Musculoskeletal: Negative for back pain. Skin: Negative for rash. Neurological: Negative for headaches, weakness or numbness. Psych: No SI or HI  ____________________________________________   PHYSICAL EXAM:  VITAL SIGNS: ED Triage Vitals  Enc Vitals Group     BP 04/07/18 0922 (!) 158/46  Pulse Rate 04/07/18 0922 69     Resp 04/07/18 0922 18     Temp 04/07/18 0922 98.1 F (36.7 C)     Temp Source 04/07/18 0922 Oral     SpO2 04/07/18 0922 100 %     Weight 04/07/18 0924 205 lb (93 kg)     Height 04/07/18 0924 5\' 9"  (1.753 m)     Head Circumference --      Peak Flow --      Pain Score 04/07/18 0935 10     Pain Loc --      Pain Edu? --      Excl. in  [place]? --     Constitutional: Alert and oriented. Well appearing and in no apparent distress. HEENT:      Head: Normocephalic and atraumatic.         Eyes: Conjunctivae are normal. Sclera is non-icteric.       Mouth/Throat: Mucous membranes are moist.       Neck: Supple with no signs of meningismus. Cardiovascular: Regular rate and rhythm. No murmurs, gallops, or rubs. 2+ symmetrical distal pulses are present in all extremities. No JVD. Respiratory: Normal respiratory effort. Lungs are clear to auscultation bilaterally. No wheezes, crackles, or rhonchi.  Gastrointestinal: Soft, tender to palpation over the RLQ, and non distended with positive bowel sounds. No rebound or guarding. Musculoskeletal: Nontender with normal Vance of motion in all extremities. No edema, cyanosis, or erythema of extremities. Neurologic: Normal speech and language. Face is symmetric. Moving all extremities. No gross focal neurologic deficits are appreciated. Skin: Skin is warm, dry and intact. No rash noted. Psychiatric: Mood and affect are normal. Speech and behavior are normal.  ____________________________________________   LABS (all labs ordered are listed, but only abnormal results are displayed)  Labs Reviewed  COMPREHENSIVE METABOLIC PANEL - Abnormal; Notable for the following components:      Result Value   BUN 31 (*)    Creatinine, Ser 1.69 (*)    AST 14 (*)    GFR calc non Af Amer 38 (*)    GFR calc Af Amer 45 (*)    All other components within normal limits  CBC - Abnormal; Notable for the following components:   RBC 3.26 (*)    Hemoglobin 11.5 (*)    HCT 32.7 (*)    MCV 100.1 (*)    MCH 35.4 (*)    All other components within normal limits  URINALYSIS, COMPLETE (UACMP) WITH MICROSCOPIC - Abnormal; Notable for the following components:   Color, Urine YELLOW (*)    APPearance CLEAR (*)    All other components within normal limits  TROPONIN I - Abnormal; Notable for the following components:    Troponin I 0.11 (*)    All other components within normal limits  LIPASE, BLOOD   ____________________________________________  EKG  ED ECG REPORT I, Rudene Re, the attending physician, personally viewed and interpreted this ECG.  Sinus bradycardia, rate of 46, first-degree AV block, normal QTC, left axis deviation, ST depressions in inferior leads and V3.  No ST elevation. No prior for comparison ____________________________________________  RADIOLOGY  I have personally reviewed the images performed during this visit and I agree with the Radiologist's read.   Interpretation by Radiologist:  Ct Abdomen Pelvis Wo Contrast  Result Date: 04/07/2018 CLINICAL DATA:  Abdominal pain, primarily right-sided there is atelectatic change in the lung bases. EXAM: CT ABDOMEN AND PELVIS WITHOUT CONTRAST TECHNIQUE: Multidetector CT imaging of the abdomen and pelvis  was performed following the standard protocol without IV contrast. COMPARISON:  None. FINDINGS: Lower chest: There is atelectatic change in lung bases. No lung base edema or consolidation. Heart is enlarged. Small amount of pericardial fluid noted, likely upper physiologic amount. There is a small hiatal hernia. Hepatobiliary: No focal liver lesions are appreciable on this noncontrast enhanced study. Gallbladder wall is not appreciably thickened. There is no biliary duct dilatation. Pancreas: No evident pancreatic mass or inflammatory focus. Spleen: No splenic lesions are evident. There is a small accessory spleen immediately adjacent to the spleen posteriorly and inferiorly. Adrenals/Urinary Tract: Adrenals bilaterally appear unremarkable. There is a cyst arising from the posterior mid right kidney measuring 2.5 x 2.4 cm. An apparent cyst arises from the posterior lower pole right kidney measuring 2.0 x 2.0 cm. A cyst arises from the lateral mid left kidney measuring 3.6 x 3.2 cm. There is no evident hydronephrosis on either side. There is  no renal or ureteral calculus on either side. The urinary bladder is midline with wall thickness within normal limits. Stomach/Bowel: There is no appreciable bowel wall or mesenteric thickening. There is no evident bowel obstruction. No free air or portal venous air. Vascular/Lymphatic: Aorta is tortuous without aneurysm. There is mild calcification in the right common iliac artery. Major mesenteric arterial vessels appear patent on this noncontrast enhanced study. There is no demonstrable adenopathy in the abdomen or pelvis by size criteria. Subcentimeter inguinal lymph nodes bilaterally are considered nonspecific. Reproductive: The prostate is borderline enlarged. The prostate abuts the inferior aspect of the urinary bladder with loss of clear fat plane separating the prostate in the inferior bladder along portions of the inferior bladder. Seminal vesicles appear unremarkable. There is no well-defined pelvic mass. Other: Appendix appears unremarkable. There is no abscess or ascites in the abdomen or pelvis. There is a small ventral hernia containing fat but no bowel. Musculoskeletal: There is extensive degenerative change in the lumbar spine. There are no blastic or lytic bone lesions. There is no intramuscular lesion evident. IMPRESSION: 1. No evident bowel obstruction. No abscess evident in the abdomen or pelvis. Appendix appears normal. 2. Prostate enlarged with loss of clear fat plane between portions of the inferior urinary bladder and prostate. This finding warrants direct clinical assessment of the prostate and PSA examination. 3.  No renal or ureteral calculus.  No hydronephrosis. 5. Cardiomegaly. Mild amount of pericardial fluid noted, likely upper physiologic. 6.  Multilevel arthropathy in the lumbar spine. 7.  Small ventral hernia containing only fat. Electronically Signed   By: Lowella Grip III M.D.   On: 04/07/2018 12:17   Ct Angio Chest Pe W And/or Wo Contrast  Result Date:  04/07/2018 CLINICAL DATA:  Abdominal pain RIGHT-sided radiating to back for 4 weeks, history hypertension EXAM: CT ANGIOGRAPHY CHEST CT ABDOMEN AND PELVIS WITH CONTRAST TECHNIQUE: Multidetector CT imaging of the chest was performed using the standard protocol during bolus administration of intravenous contrast. Multiplanar CT image reconstructions and MIPs were obtained to evaluate the vascular anatomy. Multidetector CT imaging of the abdomen and pelvis was performed using the standard protocol during bolus administration of intravenous contrast. CONTRAST:  84mL OMNIPAQUE IOHEXOL 350 MG/ML SOLN IV. No oral contrast. COMPARISON:  Noncontrast CT abdomen and pelvis 04/07/2018 FINDINGS: CTA CHEST FINDINGS Cardiovascular: Mild aneurysmal dilatation of the ascending thoracic aorta 4.1 cm diameter image 40. Additional aneurysmal dilatation of the distal aortic arch into proximal descending thoracic aorta 4.6 cm transverse. No aortic dissection identified. Pulmonary arteries well opacified and  patent. No evidence of pulmonary embolism. Enlargement of cardiac chambers. Minimal pericardial effusion. Mediastinum/Nodes: Base of cervical region normal appearance. No thoracic adenopathy. Esophagus unremarkable. Lungs/Pleura: Subsegmental atelectasis LEFT lower lobe. Remaining lungs clear. No pulmonary infiltrate, pleural effusion or pneumothorax. No pulmonary mass. Musculoskeletal: Unremarkable Review of the MIP images confirms the above findings. CT ABDOMEN and PELVIS FINDINGS Hepatobiliary: Gallbladder and liver normal appearance Pancreas: Normal appearance Spleen: Normal appearance.  Small splenule. Adrenals/Urinary Tract: 10 mm myelolipoma LEFT adrenal gland sagittal image 81. Adrenal glands otherwise normal appearance. BILATERAL renal cysts, largest lateral LEFT mid kidney 3.7 x 3.2 cm image 28. No urinary tract calcification or dilatation. Bladder and ureters unremarkable. Stomach/Bowel: Sigmoid diverticulosis without  evidence of diverticulitis. Small hiatal hernia. Normal appendix. Questionable wall thickening of the distal gastric antrum/pylorus versus artifact from underdistention. Stomach and bowel loops otherwise normal appearance. Vascular/Lymphatic: Minimal atherosclerotic calcification aorta. Aorta normal caliber. Few pelvic phleboliths. No adenopathy. Reproductive: Prostatic enlargement gland 6.4 x 5.2 cm image 70, asymmetrically larger on LEFT. Other: Umbilical hernia containing fat. No free air or free fluid. No acute intra-abdominal or intrapelvic inflammatory process. Musculoskeletal: Scattered degenerative disc disease changes of the lumbar spine. Review of the MIP images confirms the above findings. IMPRESSION: No evidence of pulmonary embolism. Question wall thickening at the distal gastric antrum pylorus, could reflect ulcer disease, mass or artifact from underdistention; recommend correlation with endoscopy. 10 mm myelolipoma LEFT adrenal gland. BILATERAL renal cysts. Minimal distal colonic diverticulosis without evidence of diverticulitis. Small hiatal hernia. Fat containing small umbilical hernia. Prostatic enlargement, asymmetrically larger on LEFT, recommend correlation with digital rectal exam and PSA. Enlargement of cardiac chambers. Aneurysmal dilatation of the thoracic aorta, 4.1 cm diameter at the ascending aorta at 4.6 cm diameter at the distal aortic arch; recommendation below. Recommend semi-annual imaging followup by CTA or MRA and referral to cardiothoracic surgery if not already obtained. This recommendation follows 2010 ACCF/AHA/AATS/ACR/ASA/SCA/SCAI/SIR/STS/SVM Guidelines for the Diagnosis and Management of Patients With Thoracic Aortic Disease. Circulation. 2010; 121: Z610-R60 Electronically Signed   By: Lavonia Dana M.D.   On: 04/07/2018 14:03   Ct Abdomen Pelvis W Contrast  Result Date: 04/07/2018 CLINICAL DATA:  Abdominal pain RIGHT-sided radiating to back for 4 weeks, history  hypertension EXAM: CT ANGIOGRAPHY CHEST CT ABDOMEN AND PELVIS WITH CONTRAST TECHNIQUE: Multidetector CT imaging of the chest was performed using the standard protocol during bolus administration of intravenous contrast. Multiplanar CT image reconstructions and MIPs were obtained to evaluate the vascular anatomy. Multidetector CT imaging of the abdomen and pelvis was performed using the standard protocol during bolus administration of intravenous contrast. CONTRAST:  5mL OMNIPAQUE IOHEXOL 350 MG/ML SOLN IV. No oral contrast. COMPARISON:  Noncontrast CT abdomen and pelvis 04/07/2018 FINDINGS: CTA CHEST FINDINGS Cardiovascular: Mild aneurysmal dilatation of the ascending thoracic aorta 4.1 cm diameter image 40. Additional aneurysmal dilatation of the distal aortic arch into proximal descending thoracic aorta 4.6 cm transverse. No aortic dissection identified. Pulmonary arteries well opacified and patent. No evidence of pulmonary embolism. Enlargement of cardiac chambers. Minimal pericardial effusion. Mediastinum/Nodes: Base of cervical region normal appearance. No thoracic adenopathy. Esophagus unremarkable. Lungs/Pleura: Subsegmental atelectasis LEFT lower lobe. Remaining lungs clear. No pulmonary infiltrate, pleural effusion or pneumothorax. No pulmonary mass. Musculoskeletal: Unremarkable Review of the MIP images confirms the above findings. CT ABDOMEN and PELVIS FINDINGS Hepatobiliary: Gallbladder and liver normal appearance Pancreas: Normal appearance Spleen: Normal appearance.  Small splenule. Adrenals/Urinary Tract: 10 mm myelolipoma LEFT adrenal gland sagittal image 81. Adrenal glands otherwise normal appearance.  BILATERAL renal cysts, largest lateral LEFT mid kidney 3.7 x 3.2 cm image 28. No urinary tract calcification or dilatation. Bladder and ureters unremarkable. Stomach/Bowel: Sigmoid diverticulosis without evidence of diverticulitis. Small hiatal hernia. Normal appendix. Questionable wall thickening of  the distal gastric antrum/pylorus versus artifact from underdistention. Stomach and bowel loops otherwise normal appearance. Vascular/Lymphatic: Minimal atherosclerotic calcification aorta. Aorta normal caliber. Few pelvic phleboliths. No adenopathy. Reproductive: Prostatic enlargement gland 6.4 x 5.2 cm image 70, asymmetrically larger on LEFT. Other: Umbilical hernia containing fat. No free air or free fluid. No acute intra-abdominal or intrapelvic inflammatory process. Musculoskeletal: Scattered degenerative disc disease changes of the lumbar spine. Review of the MIP images confirms the above findings. IMPRESSION: No evidence of pulmonary embolism. Question wall thickening at the distal gastric antrum pylorus, could reflect ulcer disease, mass or artifact from underdistention; recommend correlation with endoscopy. 10 mm myelolipoma LEFT adrenal gland. BILATERAL renal cysts. Minimal distal colonic diverticulosis without evidence of diverticulitis. Small hiatal hernia. Fat containing small umbilical hernia. Prostatic enlargement, asymmetrically larger on LEFT, recommend correlation with digital rectal exam and PSA. Enlargement of cardiac chambers. Aneurysmal dilatation of the thoracic aorta, 4.1 cm diameter at the ascending aorta at 4.6 cm diameter at the distal aortic arch; recommendation below. Recommend semi-annual imaging followup by CTA or MRA and referral to cardiothoracic surgery if not already obtained. This recommendation follows 2010 ACCF/AHA/AATS/ACR/ASA/SCA/SCAI/SIR/STS/SVM Guidelines for the Diagnosis and Management of Patients With Thoracic Aortic Disease. Circulation. 2010; 121: [id number] Electronically Signed   By: Lavonia Dana M.D.   On: 04/07/2018 14:03      ____________________________________________   PROCEDURES  Procedure(s) performed: None Procedures Critical Care performed: yes  CRITICAL CARE Performed by: Rudene Re  ?  Total critical care time: 45 min  Critical care  time was exclusive of separately billable procedures and treating other patients.  Critical care was necessary to treat or prevent imminent or life-threatening deterioration.  Critical care was time spent personally by me on the following activities: development of treatment plan with patient and/or surrogate as well as nursing, discussions with consultants, evaluation of patient's response to treatment, examination of patient, obtaining history from patient or surrogate, ordering and performing treatments and interventions, ordering and review of laboratory studies, ordering and review of radiographic studies, pulse oximetry and re-evaluation of patient's condition.  ____________________________________________   INITIAL IMPRESSION / ASSESSMENT AND PLAN / ED COURSE  72 y.o. male with a history of chronic kidney disease and hypertension who presents for evaluation of right-sided abdominal pain.  Patient is tender to palpation on the right lower quadrant, pain is not reproducible with palpations of the back/flank area.  Vitals are within normal limits, no pulsatile abdominal mass, normal pulses in all 4 extremities.  Based on the description of the pain being present with movement of the torso it makes me concerned that this is most likely muscular skeletal in nature however since patient has tenderness on the abdomen I will send him for a CT abdomen pelvis to rule out kidney pathology, kidney stone, appendicitis, AAA, PE  Labs showing stable creatinine, stable anemia, normal LFTs and lipase.  EKG shows ST depressions in inferior leads with no prior for comparison.  Troponin is pending.  Patient has no chest pain and less likely to be ACS with 4 weeks of constant pain.    _________________________ 12:59 PM on 04/07/2018 -----------------------------------------  Troponin elevated at 0.11, concerning for possible PE. CT a/p w/o contrast negative, will send patient back for CTA of the  chest   _________________________ 2:08 PM on 04/07/2018 -----------------------------------------  CT negative for PE. Will discuss with hospitalist for admission for NSTEMI. CT showing ascending aortic aneurysm, unfortunately contrast phase unable to evaluate for dissection. With pain for 4 weeks, less likely to be a dissection although with elevated troponin, dissection into the coronary could be a possibility. Will hold heparin for now and consult hospitalist admission.  As part of my medical decision making, I reviewed the following data within the [facility] notes reviewed and incorporated, Labs reviewed , EKG interpreted , Old EKG reviewed, Old chart reviewed, Radiograph reviewed , Discussed with admitting physician , Notes from prior ED visits and [place] Controlled Substance Database    Pertinent labs & imaging results that were available during my care of the patient were reviewed by me and considered in my medical decision making (see chart for details).    ____________________________________________   FINAL CLINICAL IMPRESSION(S) / ED DIAGNOSES  Final diagnoses:  NSTEMI (non-ST elevated myocardial infarction) ([place])      NEW MEDICATIONS STARTED DURING THIS VISIT:  ED Discharge Orders    None       Note:  This document was prepared using Dragon voice recognition software and may include unintentional dictation errors.    Alfred Levins, [place], MD 04/07/18 [phone]

## 2018-04-07 NOTE — Progress Notes (Signed)
Advance care planning  Purpose of Encounter Patient for abdominal pain and elevated troponin.  CODE STATUS discussion  Parties in Attendance Patient  Patients Decisional capacity Alert and oriented.  Able to make medical decisions.  Patient has no documented healthcare power of attorney or advanced directives.  Encouraged to finish documentation while in the hospital.  He wants his daughter Reaves,Jennifer to be his healthcare power of attorney.  Discussed regarding patient's need for admission, prognosis and treatment plan.  All questions answered.  Discussed CODE STATUS and patient wants to be a full code.  Orders entered  Time spent - 17 minutes

## 2018-04-07 NOTE — ED Triage Notes (Addendum)
Pt to ED with c/o of right side abdominal pain that radiates to his back that has been ongoing for 4 weeks. Was seen by PCP and given pain med w/o relief. Pt denies N/V/D and is afebrile.

## 2018-04-07 NOTE — H&P (Signed)
[name] NAME: Antonio Vance    MR#:  [id number]  DATE OF BIRTH:  Jan 27, 1946  DATE OF ADMISSION:  04/07/2018  PRIMARY CARE PHYSICIAN: [name], MD   REQUESTING/REFERRING PHYSICIAN: Dr. Alfred Levins  CHIEF COMPLAINT:   Chief Complaint  Patient presents with  . Abdominal Pain    HISTORY OF PRESENT ILLNESS:  Antonio Vance  is a 72 y.o. male with a known history of hypertension, H. pylori, gastritis presents to the hospital complaining of right-sided abdominal pain for 4 weeks.  Patient had CT scan of the abdomen done initially due to concern for renal stone which was negative.  Did show mild gastric wall thickening.  His troponin was checked and was at 0.11.  EKG looks normal.  CT chest was done which showed no PE. Patient does not have any nausea or vomiting.  No constipation or diarrhea.  No shortness of breath or palpitations.  No dizziness.  Complains of right flank pain radiating to the back.  No trauma or fall.  PAST MEDICAL HISTORY:   Past Medical History:  Diagnosis Date  . Bradycardia   . Chronic kidney disease   . Hypertension     PAST SURGICAL HISTORY:   Past Surgical History:  Procedure Laterality Date  . COLONOSCOPY    . COLONOSCOPY N/A 12/28/2016   Procedure: COLONOSCOPY;  Surgeon: Lollie Sails, MD;  Location: [facility] ENDOSCOPY;  Service: Endoscopy;  Laterality: N/A;  . ESOPHAGOGASTRODUODENOSCOPY (EGD) WITH PROPOFOL N/A 12/28/2016   Procedure: ESOPHAGOGASTRODUODENOSCOPY (EGD) WITH PROPOFOL;  Surgeon: Lollie Sails, MD;  Location: [facility] ENDOSCOPY;  Service: Endoscopy;  Laterality: N/A;  . HERNIA REPAIR      SOCIAL HISTORY:   Social History   Tobacco Use  . Smoking status: Former Research scientist (life sciences)  . Smokeless tobacco: Never Used  Substance Use Topics  . Alcohol use: No    FAMILY HISTORY:   Family History  Problem Relation Age of Onset  . Hypertension Mother   . Cancer Mother   . Hypertension Father    . Diabetes Brother   . Hypertension Brother     DRUG ALLERGIES:  No Known Allergies  REVIEW OF SYSTEMS:   Review of Systems  Constitutional: Positive for malaise/fatigue. Negative for chills and fever.  HENT: Negative for sore throat.   Eyes: Negative for blurred vision, double vision and pain.  Respiratory: Negative for cough, hemoptysis, shortness of breath and wheezing.   Cardiovascular: Negative for chest pain, palpitations, orthopnea and leg swelling.  Gastrointestinal: Positive for abdominal pain. Negative for constipation, diarrhea, heartburn, nausea and vomiting.  Genitourinary: Negative for dysuria and hematuria.  Musculoskeletal: Negative for back pain and joint pain.  Skin: Negative for rash.  Neurological: Negative for sensory change, speech change, focal weakness and headaches.  Endo/Heme/Allergies: Does not bruise/bleed easily.  Psychiatric/Behavioral: Negative for depression. The patient is not nervous/anxious.     MEDICATIONS AT HOME:   Prior to Admission medications   Medication Sig Start Date End Date Taking? Authorizing Provider  amLODipine (NORVASC) 10 MG tablet Take 10 mg by mouth daily.  09/11/14  Yes [provider]  ascorbic acid (VITAMIN C) 500 MG tablet Take 500 mg by mouth daily.   Yes [provider]  aspirin 81 MG tablet Take 81 mg by mouth daily.    Yes [provider]  b complex vitamins capsule Take 1 capsule by mouth daily.    Yes [provider]  Multiple Vitamin (  MULTIVITAMIN) tablet Take 1 tablet by mouth daily.   Yes [provider]  lisinopril (PRINIVIL,ZESTRIL) 10 MG tablet Take 10 mg by mouth daily.    [provider]     VITAL SIGNS:  Blood pressure (!) 191/61, pulse (!) 54, temperature 98.1 F (36.7 C), temperature source Oral, resp. rate 15, height 5\' 9"  (1.753 m), weight 93 kg, SpO2 99 %.  PHYSICAL EXAMINATION:  Physical Exam  GENERAL:  72 y.o.-year-old patient lying in the bed  with no acute distress.  EYES: Pupils equal, round, reactive to light and accommodation. No scleral icterus. Extraocular muscles intact.  HEENT: Head atraumatic, normocephalic. Oropharynx and nasopharynx clear. No oropharyngeal erythema, moist oral mucosa  NECK:  Supple, no jugular venous distention. No thyroid enlargement, no tenderness.  LUNGS: Normal breath sounds bilaterally, no wheezing, rales, rhonchi. No use of accessory muscles of respiration.  CARDIOVASCULAR: S1, S2 normal. No murmurs, rubs, or gallops.  ABDOMEN: Soft, right-sided abdominal tenderness, nondistended. Bowel sounds present. No organomegaly or mass.  Murphy's negative EXTREMITIES: No pedal edema, cyanosis, or clubbing. + 2 pedal & radial pulses b/l.   NEUROLOGIC: Cranial nerves II through XII are intact. No focal Motor or sensory deficits appreciated b/l PSYCHIATRIC: The patient is alert and oriented x 3. Good affect.  SKIN: No obvious rash, lesion, or ulcer.   LABORATORY PANEL:   CBC Recent Labs  Lab 04/07/18 0928  WBC 3.9  HGB 11.5*  HCT 32.7*  PLT 173   ------------------------------------------------------------------------------------------------------------------  Chemistries  Recent Labs  Lab 04/07/18 0928  NA 142  K 4.4  CL 111  CO2 24  GLUCOSE 94  BUN 31*  CREATININE 1.69*  CALCIUM 9.4  AST 14*  ALT 11  ALKPHOS 75  BILITOT 0.9   ------------------------------------------------------------------------------------------------------------------  Cardiac Enzymes Recent Labs  Lab 04/07/18 0928  TROPONINI 0.11*   ------------------------------------------------------------------------------------------------------------------  RADIOLOGY:  Ct Abdomen Pelvis Wo Contrast  Result Date: 04/07/2018 CLINICAL DATA:  Abdominal pain, primarily right-sided there is atelectatic change in the lung bases. EXAM: CT ABDOMEN AND PELVIS WITHOUT CONTRAST TECHNIQUE: Multidetector CT imaging of the abdomen  and pelvis was performed following the standard protocol without IV contrast. COMPARISON:  None. FINDINGS: Lower chest: There is atelectatic change in lung bases. No lung base edema or consolidation. Heart is enlarged. Small amount of pericardial fluid noted, likely upper physiologic amount. There is a small hiatal hernia. Hepatobiliary: No focal liver lesions are appreciable on this noncontrast enhanced study. Gallbladder wall is not appreciably thickened. There is no biliary duct dilatation. Pancreas: No evident pancreatic mass or inflammatory focus. Spleen: No splenic lesions are evident. There is a small accessory spleen immediately adjacent to the spleen posteriorly and inferiorly. Adrenals/Urinary Tract: Adrenals bilaterally appear unremarkable. There is a cyst arising from the posterior mid right kidney measuring 2.5 x 2.4 cm. An apparent cyst arises from the posterior lower pole right kidney measuring 2.0 x 2.0 cm. A cyst arises from the lateral mid left kidney measuring 3.6 x 3.2 cm. There is no evident hydronephrosis on either side. There is no renal or ureteral calculus on either side. The urinary bladder is midline with wall thickness within normal limits. Stomach/Bowel: There is no appreciable bowel wall or mesenteric thickening. There is no evident bowel obstruction. No free air or portal venous air. Vascular/Lymphatic: Aorta is tortuous without aneurysm. There is mild calcification in the right common iliac artery. Major mesenteric arterial vessels appear patent on this noncontrast enhanced study. There is no demonstrable adenopathy in the  abdomen or pelvis by size criteria. Subcentimeter inguinal lymph nodes bilaterally are considered nonspecific. Reproductive: The prostate is borderline enlarged. The prostate abuts the inferior aspect of the urinary bladder with loss of clear fat plane separating the prostate in the inferior bladder along portions of the inferior bladder. Seminal vesicles appear  unremarkable. There is no well-defined pelvic mass. Other: Appendix appears unremarkable. There is no abscess or ascites in the abdomen or pelvis. There is a small ventral hernia containing fat but no bowel. Musculoskeletal: There is extensive degenerative change in the lumbar spine. There are no blastic or lytic bone lesions. There is no intramuscular lesion evident. IMPRESSION: 1. No evident bowel obstruction. No abscess evident in the abdomen or pelvis. Appendix appears normal. 2. Prostate enlarged with loss of clear fat plane between portions of the inferior urinary bladder and prostate. This finding warrants direct clinical assessment of the prostate and PSA examination. 3.  No renal or ureteral calculus.  No hydronephrosis. 5. Cardiomegaly. Mild amount of pericardial fluid noted, likely upper physiologic. 6.  Multilevel arthropathy in the lumbar spine. 7.  Small ventral hernia containing only fat. Electronically Signed   By: Lowella Grip III M.D.   On: 04/07/2018 12:17   Ct Angio Chest Pe W And/or Wo Contrast  Result Date: 04/07/2018 CLINICAL DATA:  Abdominal pain RIGHT-sided radiating to back for 4 weeks, history hypertension EXAM: CT ANGIOGRAPHY CHEST CT ABDOMEN AND PELVIS WITH CONTRAST TECHNIQUE: Multidetector CT imaging of the chest was performed using the standard protocol during bolus administration of intravenous contrast. Multiplanar CT image reconstructions and MIPs were obtained to evaluate the vascular anatomy. Multidetector CT imaging of the abdomen and pelvis was performed using the standard protocol during bolus administration of intravenous contrast. CONTRAST:  51mL OMNIPAQUE IOHEXOL 350 MG/ML SOLN IV. No oral contrast. COMPARISON:  Noncontrast CT abdomen and pelvis 04/07/2018 FINDINGS: CTA CHEST FINDINGS Cardiovascular: Mild aneurysmal dilatation of the ascending thoracic aorta 4.1 cm diameter image 40. Additional aneurysmal dilatation of the distal aortic arch into proximal descending  thoracic aorta 4.6 cm transverse. No aortic dissection identified. Pulmonary arteries well opacified and patent. No evidence of pulmonary embolism. Enlargement of cardiac chambers. Minimal pericardial effusion. Mediastinum/Nodes: Base of cervical region normal appearance. No thoracic adenopathy. Esophagus unremarkable. Lungs/Pleura: Subsegmental atelectasis LEFT lower lobe. Remaining lungs clear. No pulmonary infiltrate, pleural effusion or pneumothorax. No pulmonary mass. Musculoskeletal: Unremarkable Review of the MIP images confirms the above findings. CT ABDOMEN and PELVIS FINDINGS Hepatobiliary: Gallbladder and liver normal appearance Pancreas: Normal appearance Spleen: Normal appearance.  Small splenule. Adrenals/Urinary Tract: 10 mm myelolipoma LEFT adrenal gland sagittal image 81. Adrenal glands otherwise normal appearance. BILATERAL renal cysts, largest lateral LEFT mid kidney 3.7 x 3.2 cm image 28. No urinary tract calcification or dilatation. Bladder and ureters unremarkable. Stomach/Bowel: Sigmoid diverticulosis without evidence of diverticulitis. Small hiatal hernia. Normal appendix. Questionable wall thickening of the distal gastric antrum/pylorus versus artifact from underdistention. Stomach and bowel loops otherwise normal appearance. Vascular/Lymphatic: Minimal atherosclerotic calcification aorta. Aorta normal caliber. Few pelvic phleboliths. No adenopathy. Reproductive: Prostatic enlargement gland 6.4 x 5.2 cm image 70, asymmetrically larger on LEFT. Other: Umbilical hernia containing fat. No free air or free fluid. No acute intra-abdominal or intrapelvic inflammatory process. Musculoskeletal: Scattered degenerative disc disease changes of the lumbar spine. Review of the MIP images confirms the above findings. IMPRESSION: No evidence of pulmonary embolism. Question wall thickening at the distal gastric antrum pylorus, could reflect ulcer disease, mass or artifact from underdistention; recommend  correlation  with endoscopy. 10 mm myelolipoma LEFT adrenal gland. BILATERAL renal cysts. Minimal distal colonic diverticulosis without evidence of diverticulitis. Small hiatal hernia. Fat containing small umbilical hernia. Prostatic enlargement, asymmetrically larger on LEFT, recommend correlation with digital rectal exam and PSA. Enlargement of cardiac chambers. Aneurysmal dilatation of the thoracic aorta, 4.1 cm diameter at the ascending aorta at 4.6 cm diameter at the distal aortic arch; recommendation below. Recommend semi-annual imaging followup by CTA or MRA and referral to cardiothoracic surgery if not already obtained. This recommendation follows 2010 ACCF/AHA/AATS/ACR/ASA/SCA/SCAI/SIR/STS/SVM Guidelines for the Diagnosis and Management of Patients With Thoracic Aortic Disease. Circulation. 2010; 121: D532-D92 Electronically Signed   By: Lavonia Dana M.D.   On: 04/07/2018 14:03   Ct Abdomen Pelvis W Contrast  Result Date: 04/07/2018 CLINICAL DATA:  Abdominal pain RIGHT-sided radiating to back for 4 weeks, history hypertension EXAM: CT ANGIOGRAPHY CHEST CT ABDOMEN AND PELVIS WITH CONTRAST TECHNIQUE: Multidetector CT imaging of the chest was performed using the standard protocol during bolus administration of intravenous contrast. Multiplanar CT image reconstructions and MIPs were obtained to evaluate the vascular anatomy. Multidetector CT imaging of the abdomen and pelvis was performed using the standard protocol during bolus administration of intravenous contrast. CONTRAST:  72mL OMNIPAQUE IOHEXOL 350 MG/ML SOLN IV. No oral contrast. COMPARISON:  Noncontrast CT abdomen and pelvis 04/07/2018 FINDINGS: CTA CHEST FINDINGS Cardiovascular: Mild aneurysmal dilatation of the ascending thoracic aorta 4.1 cm diameter image 40. Additional aneurysmal dilatation of the distal aortic arch into proximal descending thoracic aorta 4.6 cm transverse. No aortic dissection identified. Pulmonary arteries well opacified and  patent. No evidence of pulmonary embolism. Enlargement of cardiac chambers. Minimal pericardial effusion. Mediastinum/Nodes: Base of cervical region normal appearance. No thoracic adenopathy. Esophagus unremarkable. Lungs/Pleura: Subsegmental atelectasis LEFT lower lobe. Remaining lungs clear. No pulmonary infiltrate, pleural effusion or pneumothorax. No pulmonary mass. Musculoskeletal: Unremarkable Review of the MIP images confirms the above findings. CT ABDOMEN and PELVIS FINDINGS Hepatobiliary: Gallbladder and liver normal appearance Pancreas: Normal appearance Spleen: Normal appearance.  Small splenule. Adrenals/Urinary Tract: 10 mm myelolipoma LEFT adrenal gland sagittal image 81. Adrenal glands otherwise normal appearance. BILATERAL renal cysts, largest lateral LEFT mid kidney 3.7 x 3.2 cm image 28. No urinary tract calcification or dilatation. Bladder and ureters unremarkable. Stomach/Bowel: Sigmoid diverticulosis without evidence of diverticulitis. Small hiatal hernia. Normal appendix. Questionable wall thickening of the distal gastric antrum/pylorus versus artifact from underdistention. Stomach and bowel loops otherwise normal appearance. Vascular/Lymphatic: Minimal atherosclerotic calcification aorta. Aorta normal caliber. Few pelvic phleboliths. No adenopathy. Reproductive: Prostatic enlargement gland 6.4 x 5.2 cm image 70, asymmetrically larger on LEFT. Other: Umbilical hernia containing fat. No free air or free fluid. No acute intra-abdominal or intrapelvic inflammatory process. Musculoskeletal: Scattered degenerative disc disease changes of the lumbar spine. Review of the MIP images confirms the above findings. IMPRESSION: No evidence of pulmonary embolism. Question wall thickening at the distal gastric antrum pylorus, could reflect ulcer disease, mass or artifact from underdistention; recommend correlation with endoscopy. 10 mm myelolipoma LEFT adrenal gland. BILATERAL renal cysts. Minimal distal  colonic diverticulosis without evidence of diverticulitis. Small hiatal hernia. Fat containing small umbilical hernia. Prostatic enlargement, asymmetrically larger on LEFT, recommend correlation with digital rectal exam and PSA. Enlargement of cardiac chambers. Aneurysmal dilatation of the thoracic aorta, 4.1 cm diameter at the ascending aorta at 4.6 cm diameter at the distal aortic arch; recommendation below. Recommend semi-annual imaging followup by CTA or MRA and referral to cardiothoracic surgery if not already obtained. This recommendation follows 2010 ACCF/AHA/AATS/ACR/ASA/SCA/SCAI/SIR/STS/SVM  Guidelines for the Diagnosis and Management of Patients With Thoracic Aortic Disease. Circulation. 2010; 121: U932-T55 Electronically Signed   By: Lavonia Dana M.D.   On: 04/07/2018 14:03     IMPRESSION AND PLAN:   *Right abdominal pain rating to the back. Normal lipase and liver function tests.  She does have gastric wall thickening which is likely gastritis which she was diagnosed with in the past.  This could be causing his pain.  We will start him on PPIs.  Pain medications as needed.  No other acute findings on CT scan.  No gallstones.  Normal LFTs.  Doubt gallbladder causing problems.  But need to consider if no improvement.  *Elevated troponin of 0.11.  No baseline known.  No chest pain or shortness of breath.  Will monitor on telemetry.  Repeat troponin.  Check echocardiogram.  If significant worsening will need to treat this as non-ST elevation MI.  *Hypertension.  Continue home medications  DVT prophylaxis with Lovenox  All the records are reviewed and case discussed with ED provider. Management plans discussed with the patient, family and they are in agreement.  CODE STATUS: Full code  TOTAL TIME TAKING CARE OF THIS PATIENT: 40 minutes.   Leia Alf Ethen Bannan M.D on 04/07/2018 at 2:53 PM  Between 7am to 6pm - Pager - [phone]  After 6pm go to [email or web address] - password [email or web address] [facility]  SOUND  [facility]  Office  [phone]  CC: Primary care physician; [name], MD  Note: This dictation was prepared with Dragon dictation along with smaller phrase technology. Any transcriptional errors that result from this process are unintentional.

## 2018-04-07 NOTE — ED Notes (Signed)
EDMD at bedside

## 2018-04-08 ENCOUNTER — Observation Stay (HOSPITAL_BASED_OUTPATIENT_CLINIC_OR_DEPARTMENT_OTHER)
Admit: 2018-04-08 | Discharge: 2018-04-08 | Disposition: A | Discharge status: Home / Self Care | Payer: Medicare Other | Attending: Internal Medicine | Admitting: Internal Medicine

## 2018-04-08 DIAGNOSIS — I351 Nonrheumatic aortic (valve) insufficiency: Secondary | ICD-10-CM

## 2018-04-08 LAB — ECHOCARDIOGRAM COMPLETE
Height: 69 in
Weight: 3326.3 oz

## 2018-04-08 LAB — PSA: PROSTATIC SPECIFIC ANTIGEN: 6.79 ng/mL — AB (ref 0.00–4.00)

## 2018-04-08 LAB — TROPONIN I: Troponin I: 0.11 ng/mL (ref <0.03)

## 2018-04-08 MED ORDER — PANTOPRAZOLE SODIUM 40 MG PO TBEC: 40.0000 mg | DELAYED_RELEASE_TABLET | Freq: Every day | ORAL | 0 refills | Status: AC

## 2018-04-08 MED ORDER — SUCRALFATE 1 GM/10ML PO SUSP
1.0000 g | Freq: Three times a day (TID) | ORAL | Status: DC
  Administered 2018-04-08: 1 g via ORAL
  Filled 2018-04-08 (×3): qty 10

## 2018-04-08 MED ORDER — TRAMADOL HCL 50 MG PO TABS: 50.0000 mg | ORAL_TABLET | Freq: Four times a day (QID) | ORAL | 0 refills | Status: DC | PRN

## 2018-04-08 NOTE — Progress Notes (Signed)
Mr. Antonio Vance may return to work on Monday October 14 without restrictions for full duty.  Mr. Antonio Vance has been in the hospital.  Please excuse his absence from work.   Dictated by Dr. Clayborn Bigness (Cardiologist) Written by Lorn Junes, RN

## 2018-04-08 NOTE — Care Management Obs Status (Signed)
[place] NOTIFICATION   Patient Details  Name: ANGELES PAOLUCCI MRN: [id number] Date of Birth: Dec 28, 1945   Medicare Observation Status Notification Given:  No  Less than <24 hour stay; has discharge order.   Marshell Garfinkel, RN 04/08/2018, 9:37 AM

## 2018-04-08 NOTE — Discharge Summary (Signed)
[place] [facility] [place], 72 y.o., DOB [birth year], MRN [id number]. Admission date: 04/07/2018 Discharge Date 04/08/2018 Primary MD [name], MD Admitting Physician Hillary Bow, MD  Admission Diagnosis  NSTEMI (non-ST elevated myocardial infarction) [facility]) [I21.4]  Discharge Diagnosis   Active Problems:   Troponin I above reference range due to demand ischemia non-ST MI ruled out Abdominal pain possibly due to gastritis needs outpatient GI follow-up Essential hypertension Elevated PSA needs outpatient urology follow-up Ascending aortic aneurysm primary care needs to arrange for follow-up with vascular surgery       Hospital Course  Patient 72 year old presented with right-sided flank pain he underwent a CT scan which showed some gastric thickening.  Of unclear etiology.  Patient also was noted to have asymmetrical prostate.  He was admitted and further work-up was done.  He was given pain medications and started on PPIs.  With improvement in his symptoms.  He denies any chest pain or palpitations.  Patient had a troponin that was abnormal felt to be due to demand ischemia.  He will be seen by cardiology for further recommendations.  He is not having any symptoms and will be stable to be discharged with outpatient follow-up.            Consults  cardiology  Significant Tests:  See full reports for all details     Ct Abdomen Pelvis Wo Contrast  Result Date: 04/07/2018 CLINICAL DATA:  Abdominal pain, primarily right-sided there is atelectatic change in the lung bases. EXAM: CT ABDOMEN AND PELVIS WITHOUT CONTRAST TECHNIQUE: Multidetector CT imaging of the abdomen and pelvis was performed following the standard protocol without IV contrast. COMPARISON:  None. FINDINGS: Lower chest: There is atelectatic change in lung bases. No lung base edema or consolidation. Heart is enlarged. Small amount of pericardial fluid noted, likely upper  physiologic amount. There is a small hiatal hernia. Hepatobiliary: No focal liver lesions are appreciable on this noncontrast enhanced study. Gallbladder wall is not appreciably thickened. There is no biliary duct dilatation. Pancreas: No evident pancreatic mass or inflammatory focus. Spleen: No splenic lesions are evident. There is a small accessory spleen immediately adjacent to the spleen posteriorly and inferiorly. Adrenals/Urinary Tract: Adrenals bilaterally appear unremarkable. There is a cyst arising from the posterior mid right kidney measuring 2.5 x 2.4 cm. An apparent cyst arises from the posterior lower pole right kidney measuring 2.0 x 2.0 cm. A cyst arises from the lateral mid left kidney measuring 3.6 x 3.2 cm. There is no evident hydronephrosis on either side. There is no renal or ureteral calculus on either side. The urinary bladder is midline with wall thickness within normal limits. Stomach/Bowel: There is no appreciable bowel wall or mesenteric thickening. There is no evident bowel obstruction. No free air or portal venous air. Vascular/Lymphatic: Aorta is tortuous without aneurysm. There is mild calcification in the right common iliac artery. Major mesenteric arterial vessels appear patent on this noncontrast enhanced study. There is no demonstrable adenopathy in the abdomen or pelvis by size criteria. Subcentimeter inguinal lymph nodes bilaterally are considered nonspecific. Reproductive: The prostate is borderline enlarged. The prostate abuts the inferior aspect of the urinary bladder with loss of clear fat plane separating the prostate in the inferior bladder along portions of the inferior bladder. Seminal vesicles appear unremarkable. There is no well-defined pelvic mass. Other: Appendix appears unremarkable. There is no abscess or ascites in the abdomen or pelvis. There is a small ventral hernia containing  fat but no bowel. Musculoskeletal: There is extensive degenerative change in the  lumbar spine. There are no blastic or lytic bone lesions. There is no intramuscular lesion evident. IMPRESSION: 1. No evident bowel obstruction. No abscess evident in the abdomen or pelvis. Appendix appears normal. 2. Prostate enlarged with loss of clear fat plane between portions of the inferior urinary bladder and prostate. This finding warrants direct clinical assessment of the prostate and PSA examination. 3.  No renal or ureteral calculus.  No hydronephrosis. 5. Cardiomegaly. Mild amount of pericardial fluid noted, likely upper physiologic. 6.  Multilevel arthropathy in the lumbar spine. 7.  Small ventral hernia containing only fat. Electronically Signed   By: Lowella Grip III M.D.   On: 04/07/2018 12:17   Ct Angio Chest Pe W And/or Wo Contrast  Result Date: 04/07/2018 CLINICAL DATA:  Abdominal pain RIGHT-sided radiating to back for 4 weeks, history hypertension EXAM: CT ANGIOGRAPHY CHEST CT ABDOMEN AND PELVIS WITH CONTRAST TECHNIQUE: Multidetector CT imaging of the chest was performed using the standard protocol during bolus administration of intravenous contrast. Multiplanar CT image reconstructions and MIPs were obtained to evaluate the vascular anatomy. Multidetector CT imaging of the abdomen and pelvis was performed using the standard protocol during bolus administration of intravenous contrast. CONTRAST:  64mL OMNIPAQUE IOHEXOL 350 MG/ML SOLN IV. No oral contrast. COMPARISON:  Noncontrast CT abdomen and pelvis 04/07/2018 FINDINGS: CTA CHEST FINDINGS Cardiovascular: Mild aneurysmal dilatation of the ascending thoracic aorta 4.1 cm diameter image 40. Additional aneurysmal dilatation of the distal aortic arch into proximal descending thoracic aorta 4.6 cm transverse. No aortic dissection identified. Pulmonary arteries well opacified and patent. No evidence of pulmonary embolism. Enlargement of cardiac chambers. Minimal pericardial effusion. Mediastinum/Nodes: Base of cervical region normal  appearance. No thoracic adenopathy. Esophagus unremarkable. Lungs/Pleura: Subsegmental atelectasis LEFT lower lobe. Remaining lungs clear. No pulmonary infiltrate, pleural effusion or pneumothorax. No pulmonary mass. Musculoskeletal: Unremarkable Review of the MIP images confirms the above findings. CT ABDOMEN and PELVIS FINDINGS Hepatobiliary: Gallbladder and liver normal appearance Pancreas: Normal appearance Spleen: Normal appearance.  Small splenule. Adrenals/Urinary Tract: 10 mm myelolipoma LEFT adrenal gland sagittal image 81. Adrenal glands otherwise normal appearance. BILATERAL renal cysts, largest lateral LEFT mid kidney 3.7 x 3.2 cm image 28. No urinary tract calcification or dilatation. Bladder and ureters unremarkable. Stomach/Bowel: Sigmoid diverticulosis without evidence of diverticulitis. Small hiatal hernia. Normal appendix. Questionable wall thickening of the distal gastric antrum/pylorus versus artifact from underdistention. Stomach and bowel loops otherwise normal appearance. Vascular/Lymphatic: Minimal atherosclerotic calcification aorta. Aorta normal caliber. Few pelvic phleboliths. No adenopathy. Reproductive: Prostatic enlargement gland 6.4 x 5.2 cm image 70, asymmetrically larger on LEFT. Other: Umbilical hernia containing fat. No free air or free fluid. No acute intra-abdominal or intrapelvic inflammatory process. Musculoskeletal: Scattered degenerative disc disease changes of the lumbar spine. Review of the MIP images confirms the above findings. IMPRESSION: No evidence of pulmonary embolism. Question wall thickening at the distal gastric antrum pylorus, could reflect ulcer disease, mass or artifact from underdistention; recommend correlation with endoscopy. 10 mm myelolipoma LEFT adrenal gland. BILATERAL renal cysts. Minimal distal colonic diverticulosis without evidence of diverticulitis. Small hiatal hernia. Fat containing small umbilical hernia. Prostatic enlargement, asymmetrically  larger on LEFT, recommend correlation with digital rectal exam and PSA. Enlargement of cardiac chambers. Aneurysmal dilatation of the thoracic aorta, 4.1 cm diameter at the ascending aorta at 4.6 cm diameter at the distal aortic arch; recommendation below. Recommend semi-annual imaging followup by CTA or MRA and referral to cardiothoracic  surgery if not already obtained. This recommendation follows 2010 ACCF/AHA/AATS/ACR/ASA/SCA/SCAI/SIR/STS/SVM Guidelines for the Diagnosis and Management of Patients With Thoracic Aortic Disease. Circulation. 2010; 121: L798-X21 Electronically Signed   By: Lavonia Dana M.D.   On: 04/07/2018 14:03   Ct Abdomen Pelvis W Contrast  Result Date: 04/07/2018 CLINICAL DATA:  Abdominal pain RIGHT-sided radiating to back for 4 weeks, history hypertension EXAM: CT ANGIOGRAPHY CHEST CT ABDOMEN AND PELVIS WITH CONTRAST TECHNIQUE: Multidetector CT imaging of the chest was performed using the standard protocol during bolus administration of intravenous contrast. Multiplanar CT image reconstructions and MIPs were obtained to evaluate the vascular anatomy. Multidetector CT imaging of the abdomen and pelvis was performed using the standard protocol during bolus administration of intravenous contrast. CONTRAST:  66mL OMNIPAQUE IOHEXOL 350 MG/ML SOLN IV. No oral contrast. COMPARISON:  Noncontrast CT abdomen and pelvis 04/07/2018 FINDINGS: CTA CHEST FINDINGS Cardiovascular: Mild aneurysmal dilatation of the ascending thoracic aorta 4.1 cm diameter image 40. Additional aneurysmal dilatation of the distal aortic arch into proximal descending thoracic aorta 4.6 cm transverse. No aortic dissection identified. Pulmonary arteries well opacified and patent. No evidence of pulmonary embolism. Enlargement of cardiac chambers. Minimal pericardial effusion. Mediastinum/Nodes: Base of cervical region normal appearance. No thoracic adenopathy. Esophagus unremarkable. Lungs/Pleura: Subsegmental atelectasis LEFT  lower lobe. Remaining lungs clear. No pulmonary infiltrate, pleural effusion or pneumothorax. No pulmonary mass. Musculoskeletal: Unremarkable Review of the MIP images confirms the above findings. CT ABDOMEN and PELVIS FINDINGS Hepatobiliary: Gallbladder and liver normal appearance Pancreas: Normal appearance Spleen: Normal appearance.  Small splenule. Adrenals/Urinary Tract: 10 mm myelolipoma LEFT adrenal gland sagittal image 81. Adrenal glands otherwise normal appearance. BILATERAL renal cysts, largest lateral LEFT mid kidney 3.7 x 3.2 cm image 28. No urinary tract calcification or dilatation. Bladder and ureters unremarkable. Stomach/Bowel: Sigmoid diverticulosis without evidence of diverticulitis. Small hiatal hernia. Normal appendix. Questionable wall thickening of the distal gastric antrum/pylorus versus artifact from underdistention. Stomach and bowel loops otherwise normal appearance. Vascular/Lymphatic: Minimal atherosclerotic calcification aorta. Aorta normal caliber. Few pelvic phleboliths. No adenopathy. Reproductive: Prostatic enlargement gland 6.4 x 5.2 cm image 70, asymmetrically larger on LEFT. Other: Umbilical hernia containing fat. No free air or free fluid. No acute intra-abdominal or intrapelvic inflammatory process. Musculoskeletal: Scattered degenerative disc disease changes of the lumbar spine. Review of the MIP images confirms the above findings. IMPRESSION: No evidence of pulmonary embolism. Question wall thickening at the distal gastric antrum pylorus, could reflect ulcer disease, mass or artifact from underdistention; recommend correlation with endoscopy. 10 mm myelolipoma LEFT adrenal gland. BILATERAL renal cysts. Minimal distal colonic diverticulosis without evidence of diverticulitis. Small hiatal hernia. Fat containing small umbilical hernia. Prostatic enlargement, asymmetrically larger on LEFT, recommend correlation with digital rectal exam and PSA. Enlargement of cardiac chambers.  Aneurysmal dilatation of the thoracic aorta, 4.1 cm diameter at the ascending aorta at 4.6 cm diameter at the distal aortic arch; recommendation below. Recommend semi-annual imaging followup by CTA or MRA and referral to cardiothoracic surgery if not already obtained. This recommendation follows 2010 ACCF/AHA/AATS/ACR/ASA/SCA/SCAI/SIR/STS/SVM Guidelines for the Diagnosis and Management of Patients With Thoracic Aortic Disease. Circulation. 2010; 121: J941-D40 Electronically Signed   By: Lavonia Dana M.D.   On: 04/07/2018 14:03       Today   Subjective:   Antonio Vance patient denies any symptoms Objective:   Blood pressure (!) 166/82, pulse 67, temperature 98.3 F (36.8 C), temperature source Oral, resp. rate 17, height 5\' 9"  (1.753 m), weight 94.3 kg, SpO2 99 %.  Marland Kitchen  Intake/Output Summary (Last 24 hours) at 04/08/2018 1336 Last data filed at 04/08/2018 0600 Gross per 24 hour  Intake 240 ml  Output 125 ml  Net 115 ml    Exam VITAL SIGNS: Blood pressure (!) 166/82, pulse 67, temperature 98.3 F (36.8 C), temperature source Oral, resp. rate 17, height 5\' 9"  (1.753 m), weight 94.3 kg, SpO2 99 %.  GENERAL:  72 y.o.-year-old patient lying in the bed with no acute distress.  EYES: Pupils equal, round, reactive to light and accommodation. No scleral icterus. Extraocular muscles intact.  HEENT: Head atraumatic, normocephalic. Oropharynx and nasopharynx clear.  NECK:  Supple, no jugular venous distention. No thyroid enlargement, no tenderness.  LUNGS: Normal breath sounds bilaterally, no wheezing, rales,rhonchi or crepitation. No use of accessory muscles of respiration.  CARDIOVASCULAR: S1, S2 normal. No murmurs, rubs, or gallops.  ABDOMEN: Soft, nontender, nondistended. Bowel sounds present. No organomegaly or mass.  EXTREMITIES: No pedal edema, cyanosis, or clubbing.  NEUROLOGIC: Cranial nerves II through XII are intact. Muscle strength 5/5 in all extremities. Sensation intact. Gait not  checked.  PSYCHIATRIC: The patient is alert and oriented x 3.  SKIN: No obvious rash, lesion, or ulcer.   Data Review     CBC w Diff:  Lab Results  Component Value Date   WBC 3.9 04/07/2018   HGB 11.5 (L) 04/07/2018   HCT 32.7 (L) 04/07/2018   PLT 173 04/07/2018   LYMPHOPCT 36 12/28/2016   MONOPCT 9 12/28/2016   EOSPCT 1 12/28/2016   BASOPCT 1 12/28/2016   CMP:  Lab Results  Component Value Date   NA 142 04/07/2018   K 4.4 04/07/2018   CL 111 04/07/2018   CO2 24 04/07/2018   BUN 31 (H) 04/07/2018   CREATININE 1.69 (H) 04/07/2018   PROT 7.7 04/07/2018   ALBUMIN 3.7 04/07/2018   BILITOT 0.9 04/07/2018   ALKPHOS 75 04/07/2018   AST 14 (L) 04/07/2018   ALT 11 04/07/2018  .  Micro Results No results found for this or any previous visit (from the past 240 hour(s)).      Code Status Orders  (From admission, onward)         Start     Ordered   04/07/18 1452  Full code  Continuous     04/07/18 1452        Code Status History    This patient has a current code status but no historical code status.    Advance Directive Documentation     Most Recent Value  Type of Advance Directive  Healthcare Power of Attorney  Pre-existing out of facility DNR order (yellow form or pink MOST form)  -  "MOST" Form in Place?  -          Follow-up Information    [name], MD Follow up in 6 day(s).   Specialty:  Family Medicine Contact information: [place] [address] [phone]        Corey Skains, MD Follow up in 10 day(s).   Specialty:  Cardiology Why:  hosp f/u may need stress test Contact information: [facility] [place] [address] [phone]        Lollie Sails, MD Follow up in 7 day(s).   Specialty:  Gastroenterology Why:  abonrmal gastric thicking on ct of abdomen Contact information: [place] [facility] [place]  [address] [phone]  Discharge Medications   Allergies as of 04/08/2018   No Known Allergies     Medication List    TAKE these medications   amLODipine 10 MG tablet Commonly known as:  NORVASC Take 10 mg by mouth daily.   ascorbic acid 500 MG tablet Commonly known as:  VITAMIN C Take 500 mg by mouth daily.   aspirin 81 MG tablet Take 81 mg by mouth daily.   b complex vitamins capsule Take 1 capsule by mouth daily.   lisinopril 10 MG tablet Commonly known as:  PRINIVIL,ZESTRIL Take 10 mg by mouth daily.   multivitamin tablet Take 1 tablet by mouth daily.   pantoprazole 40 MG tablet Commonly known as:  PROTONIX Take 1 tablet (40 mg total) by mouth daily.   traMADol 50 MG tablet Commonly known as:  ULTRAM Take 1 tablet (50 mg total) by mouth every 6 (six) hours as needed for moderate pain.          Total Time in preparing paper work, data evaluation and todays exam - 45 minutes  Dustin Flock M.D on 04/08/2018 at [place]  [phone]

## 2018-04-08 NOTE — Consult Note (Signed)
Reason for Consult: Borderline troponins bradycardia Referring Physician: Dr. Darvin Neighbours hospitalist Dr. Delight Stare primary Cardiologist Dr. Acey Antonio Vance is an 72 y.o. male.  HPI: 72 year old black male known history of hypertension gastritis bradycardia chronic renal insufficiency presented with abdominal discomfort with mild bradycardia.  Patient had persistent abdominal discomfort had limited work-up which was unremarkable patient had heart rates in the 40s and 50s.  He had been evaluated previously by cardiology and EP in general and decided not to proceed with permanent pacemaker he is been asymptomatic from a cardiac standpoint no lightheaded no dizziness no weakness no fatigue now here for follow-up evaluation and cardiac assessment because of borderline troponin  Past Medical History:  Diagnosis Date  . Bradycardia   . Chronic kidney disease   . Hypertension     Past Surgical History:  Procedure Laterality Date  . COLONOSCOPY    . COLONOSCOPY N/A 12/28/2016   Procedure: COLONOSCOPY;  Surgeon: Lollie Sails, MD;  Location: [facility] ENDOSCOPY;  Service: Endoscopy;  Laterality: N/A;  . ESOPHAGOGASTRODUODENOSCOPY (EGD) WITH PROPOFOL N/A 12/28/2016   Procedure: ESOPHAGOGASTRODUODENOSCOPY (EGD) WITH PROPOFOL;  Surgeon: Lollie Sails, MD;  Location: [facility] ENDOSCOPY;  Service: Endoscopy;  Laterality: N/A;  . HERNIA REPAIR      Family History  Problem Relation Age of Onset  . Hypertension Mother   . Cancer Mother   . Hypertension Father   . Diabetes Brother   . Hypertension Brother     Social History:  reports that he has quit smoking. He has never used smokeless tobacco. He reports that he does not drink alcohol or use drugs.  Allergies: No Known Allergies  Medications: I have reviewed the patient's current medications.  Results for orders placed or performed during the hospital encounter of 04/07/18 (from the past 48 hour(s))  Lipase, blood     Status: None    Collection Time: 04/07/18  9:28 AM  Result Value Ref Range   Lipase 29 11 - 51 U/L    Comment: Performed at [facility], [address] [place], [place] [address]  Comprehensive metabolic panel     Status: Abnormal   Collection Time: 04/07/18  9:28 AM  Result Value Ref Range   Sodium 142 135 - 145 mmol/L   Potassium 4.4 3.5 - 5.1 mmol/L   Chloride 111 98 - 111 mmol/L   CO2 24 22 - 32 mmol/L   Glucose, Bld 94 70 - 99 mg/dL   BUN 31 (H) 8 - 23 mg/dL   Creatinine, Ser 1.69 (H) 0.61 - 1.24 mg/dL   Calcium 9.4 8.9 - 10.3 mg/dL   Total Protein 7.7 6.5 - 8.1 g/dL   Albumin 3.7 3.5 - 5.0 g/dL   AST 14 (L) 15 - 41 U/L   ALT 11 0 - 44 U/L   Alkaline Phosphatase 75 38 - 126 U/L   Total Bilirubin 0.9 0.3 - 1.2 mg/dL   GFR calc non Af Amer 38 (L) >60 mL/min   GFR calc Af Amer 45 (L) >60 mL/min    Comment: (NOTE) The eGFR has been calculated using the CKD EPI equation. This calculation has not been validated in all clinical situations. eGFR's persistently <60 mL/min signify possible Chronic Kidney Disease.    Anion gap 7 5 - 15    Comment: Performed at [facility], [address] [place], [place] [address]  CBC     Status: Abnormal   Collection Time: 04/07/18  9:28 AM  Result  Value Ref Range   WBC 3.9 3.8 - 10.6 K/uL   RBC 3.26 (L) 4.40 - 5.90 MIL/uL   Hemoglobin 11.5 (L) 13.0 - 18.0 g/dL   HCT 32.7 (L) 40.0 - 52.0 %   MCV 100.1 (H) 80.0 - 100.0 fL   MCH 35.4 (H) 26.0 - 34.0 pg   MCHC 35.4 32.0 - 36.0 g/dL   RDW 14.2 11.5 - 14.5 %   Platelets 173 150 - 440 K/uL    Comment: Performed at [facility], [address] [place], [place] [address]  Troponin I     Status: Abnormal   Collection Time: 04/07/18  9:28 AM  Result Value Ref Range   Troponin I 0.11 (HH) <0.03 ng/mL    Comment: CRITICAL RESULT CALLED TO, READ BACK BY AND VERIFIED WITH KATE BUMGARNER AT 1252 04/07/18 DAS Performed at [facility], [address] [place], [place]  [address]   Urinalysis, Complete w Microscopic     Status: Abnormal   Collection Time: 04/07/18 11:13 AM  Result Value Ref Range   Color, Urine YELLOW (A) YELLOW   APPearance CLEAR (A) CLEAR   Specific Gravity, Urine 1.017 1.005 - 1.030   pH 5.0 5.0 - 8.0   Glucose, UA NEGATIVE NEGATIVE mg/dL   Hgb urine dipstick NEGATIVE NEGATIVE   Bilirubin Urine NEGATIVE NEGATIVE   Ketones, ur NEGATIVE NEGATIVE mg/dL   Protein, ur NEGATIVE NEGATIVE mg/dL   Nitrite NEGATIVE NEGATIVE   Leukocytes, UA NEGATIVE NEGATIVE   RBC / HPF 0-5 0 - 5 RBC/hpf   WBC, UA 0-5 0 - 5 WBC/hpf   Bacteria, UA NONE SEEN NONE SEEN   Squamous Epithelial / LPF 0-5 0 - 5   Mucus PRESENT     Comment: Performed at [facility], [address] [place], [place] [address]  CK     Status: None   Collection Time: 04/07/18  1:00 PM  Result Value Ref Range   Total CK 123 49 - 397 U/L    Comment: Performed at [facility], [address] [place], [place] [address]  Troponin I     Status: Abnormal   Collection Time: 04/07/18  5:43 PM  Result Value Ref Range   Troponin I 0.11 (HH) <0.03 ng/mL    Comment: CRITICAL VALUE NOTED. VALUE IS CONSISTENT WITH PREVIOUSLY REPORTED/CALLED VALUE. JML Performed at [facility], [address] [place], [place] [address]   TSH     Status: None   Collection Time: 04/07/18  5:43 PM  Result Value Ref Range   TSH 1.982 0.350 - 4.500 uIU/mL    Comment: Performed by a 3rd Generation assay with a functional sensitivity of <=0.01 uIU/mL. Performed at [facility], [address] [place], [place] [address]   Troponin I     Status: Abnormal   Collection Time: 04/08/18 12:09 AM  Result Value Ref Range   Troponin I 0.11 (HH) <0.03 ng/mL    Comment: CRITICAL VALUE NOTED. VALUE IS CONSISTENT WITH PREVIOUSLY REPORTED/CALLED VALUE [facility] Performed at [facility], [address] [place], [place] [address]   PSA     Status: Abnormal   Collection Time:  04/08/18  8:51 AM  Result Value Ref Range   Prostatic Specific Antigen 6.79 (H) 0.00 - 4.00 ng/mL    Comment: (NOTE) While PSA levels of <=4.0 ng/ml are reported as reference range, some men with levels below 4.0 ng/ml can have prostate cancer and many men with PSA  above 4.0 ng/ml do not have prostate cancer.  Other tests such as free PSA, age specific reference ranges, PSA velocity and PSA doubling time may be helpful especially in men less than 80 years old. Performed at [facility], [place] [address], [place], [place] [address]     Ct Abdomen Pelvis Wo Contrast  Result Date: 04/07/2018 CLINICAL DATA:  Abdominal pain, primarily right-sided there is atelectatic change in the lung bases. EXAM: CT ABDOMEN AND PELVIS WITHOUT CONTRAST TECHNIQUE: Multidetector CT imaging of the abdomen and pelvis was performed following the standard protocol without IV contrast. COMPARISON:  None. FINDINGS: Lower chest: There is atelectatic change in lung bases. No lung base edema or consolidation. Heart is enlarged. Small amount of pericardial fluid noted, likely upper physiologic amount. There is a small hiatal hernia. Hepatobiliary: No focal liver lesions are appreciable on this noncontrast enhanced study. Gallbladder wall is not appreciably thickened. There is no biliary duct dilatation. Pancreas: No evident pancreatic mass or inflammatory focus. Spleen: No splenic lesions are evident. There is a small accessory spleen immediately adjacent to the spleen posteriorly and inferiorly. Adrenals/Urinary Tract: Adrenals bilaterally appear unremarkable. There is a cyst arising from the posterior mid right kidney measuring 2.5 x 2.4 cm. An apparent cyst arises from the posterior lower pole right kidney measuring 2.0 x 2.0 cm. A cyst arises from the lateral mid left kidney measuring 3.6 x 3.2 cm. There is no evident hydronephrosis on either side. There is no renal or ureteral calculus on either side. The urinary bladder  is midline with wall thickness within normal limits. Stomach/Bowel: There is no appreciable bowel wall or mesenteric thickening. There is no evident bowel obstruction. No free air or portal venous air. Vascular/Lymphatic: Aorta is tortuous without aneurysm. There is mild calcification in the right common iliac artery. Major mesenteric arterial vessels appear patent on this noncontrast enhanced study. There is no demonstrable adenopathy in the abdomen or pelvis by size criteria. Subcentimeter inguinal lymph nodes bilaterally are considered nonspecific. Reproductive: The prostate is borderline enlarged. The prostate abuts the inferior aspect of the urinary bladder with loss of clear fat plane separating the prostate in the inferior bladder along portions of the inferior bladder. Seminal vesicles appear unremarkable. There is no well-defined pelvic mass. Other: Appendix appears unremarkable. There is no abscess or ascites in the abdomen or pelvis. There is a small ventral hernia containing fat but no bowel. Musculoskeletal: There is extensive degenerative change in the lumbar spine. There are no blastic or lytic bone lesions. There is no intramuscular lesion evident. IMPRESSION: 1. No evident bowel obstruction. No abscess evident in the abdomen or pelvis. Appendix appears normal. 2. Prostate enlarged with loss of clear fat plane between portions of the inferior urinary bladder and prostate. This finding warrants direct clinical assessment of the prostate and PSA examination. 3.  No renal or ureteral calculus.  No hydronephrosis. 5. Cardiomegaly. Mild amount of pericardial fluid noted, likely upper physiologic. 6.  Multilevel arthropathy in the lumbar spine. 7.  Small ventral hernia containing only fat. Electronically Signed   By: Lowella Grip III M.D.   On: 04/07/2018 12:17   Ct Angio Chest Pe W And/or Wo Contrast  Result Date: 04/07/2018 CLINICAL DATA:  Abdominal pain RIGHT-sided radiating to back for 4  weeks, history hypertension EXAM: CT ANGIOGRAPHY CHEST CT ABDOMEN AND PELVIS WITH CONTRAST TECHNIQUE: Multidetector CT imaging of the chest was performed using the standard protocol during bolus administration of intravenous contrast. Multiplanar CT image  reconstructions and MIPs were obtained to evaluate the vascular anatomy. Multidetector CT imaging of the abdomen and pelvis was performed using the standard protocol during bolus administration of intravenous contrast. CONTRAST:  38m OMNIPAQUE IOHEXOL 350 MG/ML SOLN IV. No oral contrast. COMPARISON:  Noncontrast CT abdomen and pelvis 04/07/2018 FINDINGS: CTA CHEST FINDINGS Cardiovascular: Mild aneurysmal dilatation of the ascending thoracic aorta 4.1 cm diameter image 40. Additional aneurysmal dilatation of the distal aortic arch into proximal descending thoracic aorta 4.6 cm transverse. No aortic dissection identified. Pulmonary arteries well opacified and patent. No evidence of pulmonary embolism. Enlargement of cardiac chambers. Minimal pericardial effusion. Mediastinum/Nodes: Base of cervical region normal appearance. No thoracic adenopathy. Esophagus unremarkable. Lungs/Pleura: Subsegmental atelectasis LEFT lower lobe. Remaining lungs clear. No pulmonary infiltrate, pleural effusion or pneumothorax. No pulmonary mass. Musculoskeletal: Unremarkable Review of the MIP images confirms the above findings. CT ABDOMEN and PELVIS FINDINGS Hepatobiliary: Gallbladder and liver normal appearance Pancreas: Normal appearance Spleen: Normal appearance.  Small splenule. Adrenals/Urinary Tract: 10 mm myelolipoma LEFT adrenal gland sagittal image 81. Adrenal glands otherwise normal appearance. BILATERAL renal cysts, largest lateral LEFT mid kidney 3.7 x 3.2 cm image 28. No urinary tract calcification or dilatation. Bladder and ureters unremarkable. Stomach/Bowel: Sigmoid diverticulosis without evidence of diverticulitis. Small hiatal hernia. Normal appendix. Questionable wall  thickening of the distal gastric antrum/pylorus versus artifact from underdistention. Stomach and bowel loops otherwise normal appearance. Vascular/Lymphatic: Minimal atherosclerotic calcification aorta. Aorta normal caliber. Few pelvic phleboliths. No adenopathy. Reproductive: Prostatic enlargement gland 6.4 x 5.2 cm image 70, asymmetrically larger on LEFT. Other: Umbilical hernia containing fat. No free air or free fluid. No acute intra-abdominal or intrapelvic inflammatory process. Musculoskeletal: Scattered degenerative disc disease changes of the lumbar spine. Review of the MIP images confirms the above findings. IMPRESSION: No evidence of pulmonary embolism. Question wall thickening at the distal gastric antrum pylorus, could reflect ulcer disease, mass or artifact from underdistention; recommend correlation with endoscopy. 10 mm myelolipoma LEFT adrenal gland. BILATERAL renal cysts. Minimal distal colonic diverticulosis without evidence of diverticulitis. Small hiatal hernia. Fat containing small umbilical hernia. Prostatic enlargement, asymmetrically larger on LEFT, recommend correlation with digital rectal exam and PSA. Enlargement of cardiac chambers. Aneurysmal dilatation of the thoracic aorta, 4.1 cm diameter at the ascending aorta at 4.6 cm diameter at the distal aortic arch; recommendation below. Recommend semi-annual imaging followup by CTA or MRA and referral to cardiothoracic surgery if not already obtained. This recommendation follows 2010 ACCF/AHA/AATS/ACR/ASA/SCA/SCAI/SIR/STS/SVM Guidelines for the Diagnosis and Management of Patients With Thoracic Aortic Disease. Circulation. 2010; 121:: [id number] Signed   By: MLavonia DanaM.D.   On: 04/07/2018 14:03   Ct Abdomen Pelvis W Contrast  Result Date: 04/07/2018 CLINICAL DATA:  Abdominal pain RIGHT-sided radiating to back for 4 weeks, history hypertension EXAM: CT ANGIOGRAPHY CHEST CT ABDOMEN AND PELVIS WITH CONTRAST TECHNIQUE:  Multidetector CT imaging of the chest was performed using the standard protocol during bolus administration of intravenous contrast. Multiplanar CT image reconstructions and MIPs were obtained to evaluate the vascular anatomy. Multidetector CT imaging of the abdomen and pelvis was performed using the standard protocol during bolus administration of intravenous contrast. CONTRAST:  763mOMNIPAQUE IOHEXOL 350 MG/ML SOLN IV. No oral contrast. COMPARISON:  Noncontrast CT abdomen and pelvis 04/07/2018 FINDINGS: CTA CHEST FINDINGS Cardiovascular: Mild aneurysmal dilatation of the ascending thoracic aorta 4.1 cm diameter image 40. Additional aneurysmal dilatation of the distal aortic arch into proximal descending thoracic aorta 4.6 cm transverse. No aortic dissection identified. Pulmonary arteries well  opacified and patent. No evidence of pulmonary embolism. Enlargement of cardiac chambers. Minimal pericardial effusion. Mediastinum/Nodes: Base of cervical region normal appearance. No thoracic adenopathy. Esophagus unremarkable. Lungs/Pleura: Subsegmental atelectasis LEFT lower lobe. Remaining lungs clear. No pulmonary infiltrate, pleural effusion or pneumothorax. No pulmonary mass. Musculoskeletal: Unremarkable Review of the MIP images confirms the above findings. CT ABDOMEN and PELVIS FINDINGS Hepatobiliary: Gallbladder and liver normal appearance Pancreas: Normal appearance Spleen: Normal appearance.  Small splenule. Adrenals/Urinary Tract: 10 mm myelolipoma LEFT adrenal gland sagittal image 81. Adrenal glands otherwise normal appearance. BILATERAL renal cysts, largest lateral LEFT mid kidney 3.7 x 3.2 cm image 28. No urinary tract calcification or dilatation. Bladder and ureters unremarkable. Stomach/Bowel: Sigmoid diverticulosis without evidence of diverticulitis. Small hiatal hernia. Normal appendix. Questionable wall thickening of the distal gastric antrum/pylorus versus artifact from underdistention. Stomach and  bowel loops otherwise normal appearance. Vascular/Lymphatic: Minimal atherosclerotic calcification aorta. Aorta normal caliber. Few pelvic phleboliths. No adenopathy. Reproductive: Prostatic enlargement gland 6.4 x 5.2 cm image 70, asymmetrically larger on LEFT. Other: Umbilical hernia containing fat. No free air or free fluid. No acute intra-abdominal or intrapelvic inflammatory process. Musculoskeletal: Scattered degenerative disc disease changes of the lumbar spine. Review of the MIP images confirms the above findings. IMPRESSION: No evidence of pulmonary embolism. Question wall thickening at the distal gastric antrum pylorus, could reflect ulcer disease, mass or artifact from underdistention; recommend correlation with endoscopy. 10 mm myelolipoma LEFT adrenal gland. BILATERAL renal cysts. Minimal distal colonic diverticulosis without evidence of diverticulitis. Small hiatal hernia. Fat containing small umbilical hernia. Prostatic enlargement, asymmetrically larger on LEFT, recommend correlation with digital rectal exam and PSA. Enlargement of cardiac chambers. Aneurysmal dilatation of the thoracic aorta, 4.1 cm diameter at the ascending aorta at 4.6 cm diameter at the distal aortic arch; recommendation below. Recommend semi-annual imaging followup by CTA or MRA and referral to cardiothoracic surgery if not already obtained. This recommendation follows 2010 ACCF/AHA/AATS/ACR/ASA/SCA/SCAI/SIR/STS/SVM Guidelines for the Diagnosis and Management of Patients With Thoracic Aortic Disease. Circulation. 2010; 121: [id number] Electronically Signed   By: Lavonia Dana M.D.   On: 04/07/2018 14:03    Review of Systems  Constitutional: Positive for diaphoresis and malaise/fatigue.  HENT: Negative.   Eyes: Negative.   Respiratory: Negative.   Cardiovascular: Negative.   Gastrointestinal: Positive for abdominal pain and heartburn.  Genitourinary: Negative.   Musculoskeletal: Positive for back pain.  Skin: Negative.    Neurological: Negative.   Endo/Heme/Allergies: Negative.   Psychiatric/Behavioral: Negative.    Blood pressure (!) 166/82, pulse 67, temperature 98.3 F (36.8 C), temperature source Oral, resp. rate 17, height 5' 9" (1.753 m), weight 94.3 kg, SpO2 99 %. Physical Exam  Nursing note and vitals reviewed. Constitutional: He is oriented to person, place, and time. He appears well-developed and well-nourished.  HENT:  Head: Normocephalic and atraumatic.  Eyes: Pupils are equal, round, and reactive to light. Conjunctivae and EOM are normal.  Neck: Normal range of motion. Neck supple.  Cardiovascular: Normal rate, regular rhythm and normal heart sounds.  Respiratory: Effort normal and breath sounds normal.  GI: Soft. Bowel sounds are normal.  Musculoskeletal: Normal range of motion.  Neurological: He is alert and oriented to person, place, and time. He has normal reflexes.  Skin: Skin is warm and dry.  Psychiatric: He has a normal mood and affect.    Assessment/Plan: Bradycardia Abdominal pain Hypertension Chronic renal insufficiency Borderline troponins . Plan Agree with admit rule out myocardial infarction Follow-up cardiac enzymes and EKG Echocardiogram will be helpful  for assessment of left ventricular function No clear indication for permanent pacemaker at this point Consider having patient follow-up with nephrology for renal insufficiency Continue hypertension control with amlodipine and ACE inhibitor Demand ischemia is evident by elevated troponins recommend conservative therapy    D  04/08/2018, 3:57 PM

## 2018-04-08 NOTE — Progress Notes (Signed)
RN removed his IV and patient will leave in a private vehicle with his family.  Phillis Knack, RN

## 2018-04-21 DIAGNOSIS — I700 Atherosclerosis of aorta: Secondary | ICD-10-CM | POA: Insufficient documentation

## 2018-04-21 DIAGNOSIS — I712 Thoracic aortic aneurysm, without rupture, unspecified: Secondary | ICD-10-CM | POA: Insufficient documentation

## 2018-04-21 DIAGNOSIS — R001 Bradycardia, unspecified: Secondary | ICD-10-CM | POA: Insufficient documentation

## 2018-04-24 ENCOUNTER — Telehealth: Payer: Self-pay | Admitting: Urology

## 2018-04-24 NOTE — Telephone Encounter (Signed)
Patient previously had lab drawn on 04-08-18

## 2018-04-24 NOTE — Telephone Encounter (Signed)
Can you make sure that this patient has lab orders in now because he will go to [facility] prior to his app in Nov. To get his PSA drawn and I was told he would go like a week prior. I didn't see any in his chart.   Sharyn Lull

## 2018-05-05 ENCOUNTER — Other Ambulatory Visit: Payer: Self-pay | Admitting: Family Medicine

## 2018-05-05 DIAGNOSIS — R972 Elevated prostate specific antigen [PSA]: Secondary | ICD-10-CM

## 2018-05-19 ENCOUNTER — Other Ambulatory Visit
Admission: RE | Admit: 2018-05-19 | Discharge: 2018-05-19 | Disposition: A | Discharge status: Home / Self Care | Payer: Medicare Other | Source: Ambulatory Visit | Attending: Urology | Admitting: Urology

## 2018-05-19 DIAGNOSIS — R972 Elevated prostate specific antigen [PSA]: Secondary | ICD-10-CM | POA: Insufficient documentation

## 2018-05-19 LAB — PSA: Prostatic Specific Antigen: 6.31 ng/mL — ABNORMAL HIGH (ref 0.00–4.00)

## 2018-05-24 ENCOUNTER — Ambulatory Visit: Admission: RE | Admit: 2018-05-24 | Payer: Medicare Other | Source: Ambulatory Visit | Admitting: Internal Medicine

## 2018-05-24 ENCOUNTER — Encounter: Admission: RE | Payer: Self-pay | Source: Ambulatory Visit

## 2018-05-24 SURGERY — ESOPHAGOGASTRODUODENOSCOPY (EGD) WITH PROPOFOL
Anesthesia: General

## 2018-05-26 ENCOUNTER — Ambulatory Visit: Payer: Medicare Other | Admitting: Urology

## 2018-05-31 ENCOUNTER — Ambulatory Visit (INDEPENDENT_AMBULATORY_CARE_PROVIDER_SITE_OTHER): Payer: Medicare Other | Admitting: Urology

## 2018-05-31 ENCOUNTER — Encounter: Payer: Self-pay | Admitting: Urology

## 2018-05-31 VITALS — BP 130/82 | HR 55 | Ht 69.0 in | Wt 222.0 lb

## 2018-05-31 DIAGNOSIS — R972 Elevated prostate specific antigen [PSA]: Secondary | ICD-10-CM | POA: Diagnosis not present

## 2018-05-31 NOTE — Progress Notes (Signed)
05/31/2018 10:22 AM  Antonio Vance [birth year] [id number]  Referring provider: [name], [place], [place] [address]  Chief Complaint  Patient presents with  . Follow-up    HPI: Antonio Vance is a 72 y.o. male with a history of elevated PSA that presents today for a 6 month follow up for his elevated PSA.  Today, he remains asymptomatic.  He denies any urinary issues including urgency, frequency, or any other urinary problems.  The patient's daughter reports that he had a recent admission for no neurologic issues in 04/2017 underwent extensive work-up.  This included CT abdomen pelvis which showed incidental asymmetric prostate on 04/07/2018.  Urologic follow-up was recommended.    PSA trend:  5.5 (05/2017) 6.44 (07/2017) 6.2 (11/2017) 6.79 (04/2018) 6.31 (05/2018)   PMH: Past Medical History:  Diagnosis Date  . Bradycardia   . Chronic kidney disease   . Hypertension     Surgical History: Past Surgical History:  Procedure Laterality Date  . COLONOSCOPY    . COLONOSCOPY N/A 12/28/2016   Procedure: COLONOSCOPY;  Surgeon: Lollie Sails, MD;  Location: [facility] ENDOSCOPY;  Service: Endoscopy;  Laterality: N/A;  . ESOPHAGOGASTRODUODENOSCOPY (EGD) WITH PROPOFOL N/A 12/28/2016   Procedure: ESOPHAGOGASTRODUODENOSCOPY (EGD) WITH PROPOFOL;  Surgeon: Lollie Sails, MD;  Location: [facility] ENDOSCOPY;  Service: Endoscopy;  Laterality: N/A;  . HERNIA REPAIR      Home Medications:  Allergies as of 05/31/2018   No Known Allergies     Medication List        Accurate as of 05/31/18 10:22 AM. Always use your most recent med list.          amLODipine 10 MG tablet Commonly known as:  NORVASC Take 10 mg by mouth daily.   ascorbic acid 500 MG tablet Commonly known as:  VITAMIN C Take 500 mg by mouth daily.   aspirin 81 MG tablet Take 81 mg by mouth daily.   b complex vitamins capsule Take 1 capsule by mouth daily.   lisinopril 10 MG  tablet Commonly known as:  PRINIVIL,ZESTRIL Take 10 mg by mouth daily.   multivitamin tablet Take 1 tablet by mouth daily.   pantoprazole 40 MG tablet Commonly known as:  PROTONIX Take 1 tablet (40 mg total) by mouth daily.   traMADol 50 MG tablet Commonly known as:  ULTRAM Take 1 tablet (50 mg total) by mouth every 6 (six) hours as needed for moderate pain.      Allergies: No Known Allergies  Family History: Family History  Problem Relation Age of Onset  . Hypertension Mother   . Cancer Mother   . Hypertension Father   . Diabetes Brother   . Hypertension Brother    Social History:  reports that he has quit smoking. He has never used smokeless tobacco. He reports that he does not drink alcohol or use drugs.  ROS: UROLOGY Frequent Urination?: Yes Hard to postpone urination?: Yes Burning/pain with urination?: No Get up at night to urinate?: Yes Leakage of urine?: No Urine stream starts and stops?: No Trouble starting stream?: No Do you have to strain to urinate?: No Blood in urine?: No Urinary tract infection?: No Sexually transmitted disease?: No Injury to kidneys or bladder?: No Painful intercourse?: No Weak stream?: No Erection problems?: No Penile pain?: No  Gastrointestinal Nausea?: No Vomiting?: No Indigestion/heartburn?: Yes Diarrhea?: No Constipation?: No  Constitutional Fever: No Night sweats?: No Weight loss?: No Fatigue?: No  Skin Skin rash/lesions?: No  Itching?: No  Eyes Blurred vision?: No Double vision?: No  Ears/Nose/Throat Sore throat?: No Sinus problems?: No  Hematologic/Lymphatic Swollen glands?: No Easy bruising?: No  Cardiovascular Leg swelling?: No Chest pain?: No  Respiratory Cough?: No Shortness of breath?: No  Endocrine Excessive thirst?: No  Musculoskeletal Back pain?: No Joint pain?: No  Neurological Headaches?: No Dizziness?: No  Psychologic Depression?: No Anxiety?: No  Physical Exam: BP  130/82 (BP Location: Left Arm)   Pulse (!) 55   Ht 5\' 9"  (1.753 m)   Wt 222 lb (100.7 kg)   BMI 32.78 kg/m   Constitutional:  Alert and oriented, No acute distress.  Accompanied by daughter today. Respiratory: Normal respiratory effort, no increased work of breathing. GI: Abdomen is soft, nontender, nondistended, no abdominal masses GU: No CVA tenderness.  Rectal: Normal sphincter tone. Prostate was 50+ grams. Non-tender, no nodules, rubbery.  Skin: No rashes, bruises or suspicious lesions. Neurologic: Grossly intact, no focal deficits, moving all 4 extremities. Psychiatric: Normal mood and affect.  Laboratory Data: Lab Results  Component Value Date   WBC 3.9 04/07/2018   HGB 11.5 (L) 04/07/2018   HCT 32.7 (L) 04/07/2018   MCV 100.1 (H) 04/07/2018   PLT 173 04/07/2018    Lab Results  Component Value Date   CREATININE 1.69 (H) 04/07/2018   Urinalysis    Component Value Date/Time   COLORURINE YELLOW (A) 04/07/2018 1113   APPEARANCEUR CLEAR (A) 04/07/2018 1113   LABSPEC 1.017 04/07/2018 1113   PHURINE 5.0 04/07/2018 1113   GLUCOSEU NEGATIVE 04/07/2018 1113   HGBUR NEGATIVE 04/07/2018 1113   BILIRUBINUR NEGATIVE 04/07/2018 1113   KETONESUR NEGATIVE 04/07/2018 1113   PROTEINUR NEGATIVE 04/07/2018 1113   NITRITE NEGATIVE 04/07/2018 1113   LEUKOCYTESUR NEGATIVE 04/07/2018 1113   Lab Results  Component Value Date   BACTERIA NONE SEEN 04/07/2018    Pertinent Imaging: CLINICAL DATA:  Abdominal pain RIGHT-sided radiating to back for 4 weeks, history hypertension  EXAM: CT ANGIOGRAPHY CHEST  CT ABDOMEN AND PELVIS WITH CONTRAST  TECHNIQUE: Multidetector CT imaging of the chest was performed using the standard protocol during bolus administration of intravenous contrast. Multiplanar CT image reconstructions and MIPs were obtained to evaluate the vascular anatomy. Multidetector CT imaging of the abdomen and pelvis was performed using the standard protocol during  bolus administration of intravenous contrast.  CONTRAST:  36mL OMNIPAQUE IOHEXOL 350 MG/ML SOLN IV. No oral contrast.  COMPARISON:  Noncontrast CT abdomen and pelvis 04/07/2018  FINDINGS: CTA CHEST FINDINGS  Cardiovascular: Mild aneurysmal dilatation of the ascending thoracic aorta 4.1 cm diameter image 40. Additional aneurysmal dilatation of the distal aortic arch into proximal descending thoracic aorta 4.6 cm transverse. No aortic dissection identified. Pulmonary arteries well opacified and patent. No evidence of pulmonary embolism. Enlargement of cardiac chambers. Minimal pericardial effusion.  Mediastinum/Nodes: Base of cervical region normal appearance. No thoracic adenopathy. Esophagus unremarkable.  Lungs/Pleura: Subsegmental atelectasis LEFT lower lobe. Remaining lungs clear. No pulmonary infiltrate, pleural effusion or pneumothorax. No pulmonary mass.  Musculoskeletal: Unremarkable  Review of the MIP images confirms the above findings.  CT ABDOMEN and PELVIS FINDINGS  Hepatobiliary: Gallbladder and liver normal appearance  Pancreas: Normal appearance  Spleen: Normal appearance.  Small splenule.  Adrenals/Urinary Tract: 10 mm myelolipoma LEFT adrenal gland sagittal image 81. Adrenal glands otherwise normal appearance. BILATERAL renal cysts, largest lateral LEFT mid kidney 3.7 x 3.2 cm image 28. No urinary tract calcification or dilatation. Bladder and ureters unremarkable.  Stomach/Bowel: Sigmoid diverticulosis without evidence of  diverticulitis. Small hiatal hernia. Normal appendix. Questionable wall thickening of the distal gastric antrum/pylorus versus artifact from underdistention. Stomach and bowel loops otherwise normal appearance.  Vascular/Lymphatic: Minimal atherosclerotic calcification aorta. Aorta normal caliber. Few pelvic phleboliths. No adenopathy.  Reproductive: Prostatic enlargement gland 6.4 x 5.2 cm image 70, asymmetrically  larger on LEFT.  Other: Umbilical hernia containing fat. No free air or free fluid. No acute intra-abdominal or intrapelvic inflammatory process.  Musculoskeletal: Scattered degenerative disc disease changes of the lumbar spine.  Review of the MIP images confirms the above findings.  IMPRESSION: No evidence of pulmonary embolism.  Question wall thickening at the distal gastric antrum pylorus, could reflect ulcer disease, mass or artifact from underdistention; recommend correlation with endoscopy.  10 mm myelolipoma LEFT adrenal gland.  BILATERAL renal cysts.  Minimal distal colonic diverticulosis without evidence of diverticulitis.  Small hiatal hernia.  Fat containing small umbilical hernia.  Prostatic enlargement, asymmetrically larger on LEFT, recommend correlation with digital rectal exam and PSA.  Enlargement of cardiac chambers.  Aneurysmal dilatation of the thoracic aorta, 4.1 cm diameter at the ascending aorta at 4.6 cm diameter at the distal aortic arch; recommendation below.  Recommend semi-annual imaging followup by CTA or MRA and referral to cardiothoracic surgery if not already obtained. This recommendation follows 2010 ACCF/AHA/AATS/ACR/ASA/SCA/SCAI/SIR/STS/SVM Guidelines for the Diagnosis and Management of Patients With Thoracic Aortic Disease. Circulation. 2010; 121: K812-X51   Electronically Signed   By: Lavonia Dana M.D.   On: 04/07/2018 14:03  CLINICAL DATA:  Abdominal pain, primarily right-sided there is atelectatic change in the lung bases.  EXAM: CT ABDOMEN AND PELVIS WITHOUT CONTRAST  TECHNIQUE: Multidetector CT imaging of the abdomen and pelvis was performed following the standard protocol without IV contrast.  COMPARISON:  None.  FINDINGS: Lower chest: There is atelectatic change in lung bases. No lung base edema or consolidation. Heart is enlarged. Small amount of pericardial fluid noted, likely upper  physiologic amount. There is a small hiatal hernia.  Hepatobiliary: No focal liver lesions are appreciable on this noncontrast enhanced study. Gallbladder wall is not appreciably thickened. There is no biliary duct dilatation.  Pancreas: No evident pancreatic mass or inflammatory focus.  Spleen: No splenic lesions are evident. There is a small accessory spleen immediately adjacent to the spleen posteriorly and inferiorly.  Adrenals/Urinary Tract: Adrenals bilaterally appear unremarkable. There is a cyst arising from the posterior mid right kidney measuring 2.5 x 2.4 cm. An apparent cyst arises from the posterior lower pole right kidney measuring 2.0 x 2.0 cm. A cyst arises from the lateral mid left kidney measuring 3.6 x 3.2 cm. There is no evident hydronephrosis on either side. There is no renal or ureteral calculus on either side. The urinary bladder is midline with wall thickness within normal limits.  Stomach/Bowel: There is no appreciable bowel wall or mesenteric thickening. There is no evident bowel obstruction. No free air or portal venous air.  Vascular/Lymphatic: Aorta is tortuous without aneurysm. There is mild calcification in the right common iliac artery. Major mesenteric arterial vessels appear patent on this noncontrast enhanced study. There is no demonstrable adenopathy in the abdomen or pelvis by size criteria. Subcentimeter inguinal lymph nodes bilaterally are considered nonspecific.  Reproductive: The prostate is borderline enlarged. The prostate abuts the inferior aspect of the urinary bladder with loss of clear fat plane separating the prostate in the inferior bladder along portions of the inferior bladder. Seminal vesicles appear unremarkable. There is no well-defined pelvic mass.  Other: Appendix appears unremarkable.  There is no abscess or ascites in the abdomen or pelvis. There is a small ventral hernia containing fat but no  bowel.  Musculoskeletal: There is extensive degenerative change in the lumbar spine. There are no blastic or lytic bone lesions. There is no intramuscular lesion evident.  IMPRESSION: 1. No evident bowel obstruction. No abscess evident in the abdomen or pelvis. Appendix appears normal.  2. Prostate enlarged with loss of clear fat plane between portions of the inferior urinary bladder and prostate. This finding warrants direct clinical assessment of the prostate and PSA examination.  3.  No renal or ureteral calculus.  No hydronephrosis.  5. Cardiomegaly. Mild amount of pericardial fluid noted, likely upper physiologic.  6.  Multilevel arthropathy in the lumbar spine.  7.  Small ventral hernia containing only fat.   Electronically Signed   By: Lowella Grip III M.D.   On: 04/07/2018 12:17  CT scan was personally reviewed today.   CT scan findings are nonspecific and not particularly remarkable.  No adenopathy appreciated.  Assessment & Plan:    1. Elevated PSA - Stably elevated PSA in 5-6 range, discussed following it annually due to age and his historic PSA trend. - Rectal exam today unremarkable other than prostamegaly which is reassuring - His daughter reports that on 04/07/2018 a ER doctor reported an asymmetrical prostate and expressed concerns for cancer.  Discussed that although CT may report asymmetrical prostate, minimal concern based on CT scan finding report alone.  Discussed that normally the recommendation is to stop PSA trending after the age of 4 due to greater risk than positive health outcomes with diagnostic procedures as well as treatment. such as the (risk of sepsis associated with prostate biopsy).  - RTC 9 months w/ PSA and rectal exam  Return in about 9 months (around 03/01/2019) for PSA (prior) / DRE.  Hollice Espy, MD  [facility] [address] [place], [place] [address] [phone]  I,  Stephania Fragmin , am acting as a scribe for Hollice Espy, MD  I have reviewed the above documentation for accuracy and completeness, and I agree with the above.   Hollice Espy, MD

## 2018-12-08 DIAGNOSIS — E782 Mixed hyperlipidemia: Secondary | ICD-10-CM | POA: Insufficient documentation

## 2018-12-08 DIAGNOSIS — I119 Hypertensive heart disease without heart failure: Secondary | ICD-10-CM | POA: Insufficient documentation

## 2019-02-23 ENCOUNTER — Other Ambulatory Visit
Admission: RE | Admit: 2019-02-23 | Discharge: 2019-02-23 | Disposition: A | Discharge status: Home / Self Care | Payer: 59 | Attending: Urology | Admitting: Urology

## 2019-02-23 ENCOUNTER — Other Ambulatory Visit: Payer: Self-pay

## 2019-02-23 DIAGNOSIS — R972 Elevated prostate specific antigen [PSA]: Secondary | ICD-10-CM | POA: Diagnosis not present

## 2019-02-23 LAB — PSA: Prostatic Specific Antigen: 8.39 ng/mL — ABNORMAL HIGH (ref 0.00–4.00)

## 2019-03-02 ENCOUNTER — Encounter: Payer: Self-pay | Admitting: Urology

## 2019-03-02 ENCOUNTER — Ambulatory Visit (INDEPENDENT_AMBULATORY_CARE_PROVIDER_SITE_OTHER): Payer: Medicare Other | Admitting: Urology

## 2019-03-02 ENCOUNTER — Other Ambulatory Visit: Payer: Self-pay

## 2019-03-02 VITALS — BP 139/65 | HR 74 | Ht 69.0 in | Wt 216.0 lb

## 2019-03-02 DIAGNOSIS — N4289 Other specified disorders of prostate: Secondary | ICD-10-CM

## 2019-03-02 DIAGNOSIS — R972 Elevated prostate specific antigen [PSA]: Secondary | ICD-10-CM | POA: Diagnosis not present

## 2019-03-02 NOTE — Progress Notes (Signed)
03/02/2019 3:10 PM   Antonio Vance 1945/08/28 [id number]  Referring provider: [name], [place],  [place] [address]  Chief Complaint  Patient presents with  . Elevated PSA    59mo    HPI: 73 y.o. male with a history of elevated PSA that presents today for a 6 month follow up for his elevated PSA.  CT abdomen pelvis which showed incidental asymmetric prostate on 04/07/2018.  Urologic follow-up was recommended.    Most recently, his PSA continues to climb now up to 8.39 on 02/23/2019.  Terms of symptoms, he has very few.  He feels like he empties his bladder without urgency or frequency.  No weight loss or bone pain.  No dysuria or gross hematuria.  PSA trend:  5.5 (05/2017) 6.44 (07/2017) 6.2 (11/2017) 6.79 (04/2018) 6.31 (05/2018) 8.39 (02/2019)   PMH: Past Medical History:  Diagnosis Date  . Bradycardia   . Chronic kidney disease   . Hypertension     Surgical History: Past Surgical History:  Procedure Laterality Date  . COLONOSCOPY    . COLONOSCOPY N/A 12/28/2016   Procedure: COLONOSCOPY;  Surgeon: Lollie Sails, MD;  Location: [facility] ENDOSCOPY;  Service: Endoscopy;  Laterality: N/A;  . ESOPHAGOGASTRODUODENOSCOPY (EGD) WITH PROPOFOL N/A 12/28/2016   Procedure: ESOPHAGOGASTRODUODENOSCOPY (EGD) WITH PROPOFOL;  Surgeon: Lollie Sails, MD;  Location: [facility] ENDOSCOPY;  Service: Endoscopy;  Laterality: N/A;  . HERNIA REPAIR      Home Medications:  Allergies as of 03/02/2019   No Known Allergies     Medication List       Accurate as of March 02, 2019 11:59 PM. If you have any questions, ask your nurse or doctor.        STOP taking these medications   traMADol 50 MG tablet Commonly known as: ULTRAM Stopped by: Hollice Espy, MD     TAKE these medications   amLODipine 10 MG tablet Commonly known as: NORVASC Take 10 mg by mouth daily.   ascorbic acid 500 MG tablet Commonly known as: VITAMIN C Take 500 mg by mouth daily.    aspirin 81 MG tablet Take 81 mg by mouth daily.   b complex vitamins capsule Take 1 capsule by mouth daily.   lisinopril 10 MG tablet Commonly known as: ZESTRIL Take 10 mg by mouth daily.   multivitamin tablet Take 1 tablet by mouth daily.   pantoprazole 40 MG tablet Commonly known as: PROTONIX Take 1 tablet (40 mg total) by mouth daily.       Allergies: No Known Allergies  Family History: Family History  Problem Relation Age of Onset  . Hypertension Mother   . Cancer Mother   . Hypertension Father   . Diabetes Brother   . Hypertension Brother     Social History:  reports that he has quit smoking. He has never used smokeless tobacco. He reports that he does not drink alcohol or use drugs.  ROS: UROLOGY Frequent Urination?: No Hard to postpone urination?: No Burning/pain with urination?: No Get up at night to urinate?: No Leakage of urine?: No Urine stream starts and stops?: No Trouble starting stream?: No Do you have to strain to urinate?: No Blood in urine?: No Urinary tract infection?: No Sexually transmitted disease?: No Injury to kidneys or bladder?: No Painful intercourse?: No Weak stream?: No Erection problems?: No Penile pain?: No  Gastrointestinal Nausea?: No Vomiting?: No Indigestion/heartburn?: No Diarrhea?: No Constipation?: No  Constitutional Fever: No Night sweats?: No  Weight loss?: No Fatigue?: No  Skin Skin rash/lesions?: No Itching?: No  Eyes Blurred vision?: No Double vision?: No  Ears/Nose/Throat Sore throat?: No Sinus problems?: No  Hematologic/Lymphatic Swollen glands?: No Easy bruising?: No  Cardiovascular Leg swelling?: No Chest pain?: No  Respiratory Cough?: No Shortness of breath?: Yes  Endocrine Excessive thirst?: No  Musculoskeletal Back pain?: No Joint pain?: No  Neurological Headaches?: No Dizziness?: No  Psychologic Depression?: No Anxiety?: No  Physical Exam: BP 139/65   Pulse  74   Ht 5\' 9"  (1.753 m)   Wt 216 lb (98 kg)   BMI 31.90 kg/m   Constitutional:  Alert and oriented, No acute distress. HEENT: [place] AT, moist mucus membranes.  Trachea midline, no masses. Cardiovascular: No clubbing, cyanosis, or edema. Respiratory: Normal respiratory effort, no increased work of breathing. Skin: No rashes, bruises or suspicious lesions. Neurologic: Grossly intact, no focal deficits, moving all 4 extremities. Psychiatric: Normal mood and affect.  Laboratory Data: Lab Results  Component Value Date   WBC 3.9 04/07/2018   HGB 11.5 (L) 04/07/2018   HCT 32.7 (L) 04/07/2018   MCV 100.1 (H) 04/07/2018   PLT 173 04/07/2018    Lab Results  Component Value Date   CREATININE 1.69 (H) 04/07/2018    PSA as above   Assessment & Plan:    1. Elevated PSA Personal history of elevated PSA which appears to be rising over the last year At this point time, I do strongly advocate for further evaluation in the form of MRI versus prostate biopsy He does have a personal history of elevated creatinine, may or may not be able to receive IV contrast, however, if not, an MR without contrast may also be helpful Alternatives include prostate biopsy which has risk including primarily infection/bleeding After testing the risk and benefits of each, he is interested in pursuing prostate MR and will consider further diagnostic evaluation in the form of biopsy if deemed necessary based on the findings I will call him with these results and will discuss a follow-up plan based on findings He is agreeable this plan - MR [place]; Future  2. Prostate asymmetry Incidental finding on previous CT scan Significance unknown See above   Return for MR prostate- will call with results.  Hollice Espy, MD  [facility] [address], [place], [place] [address] [phone]

## 2019-03-05 ENCOUNTER — Encounter: Payer: Self-pay | Admitting: Urology

## 2019-03-05 ENCOUNTER — Telehealth: Payer: Self-pay | Admitting: Urology

## 2019-03-05 NOTE — Telephone Encounter (Signed)
Pt's daughter, Jevaughn Jeanphilippe and has some questions about her father and his care.  Please call her at [phone]

## 2019-03-05 NOTE — Telephone Encounter (Signed)
Pt daughter Anderson Malta, calling asking how did her father's appt go last Friday, has some questions. Please advise. Thank you.

## 2019-03-06 NOTE — Telephone Encounter (Signed)
Spoke to [place] and informed her that we will wait for the MRI results before making a decision on doing a Prostates Biopsy. Daughter voiced understanding.

## 2019-03-16 ENCOUNTER — Ambulatory Visit: Payer: Medicare Other | Admitting: *Deleted

## 2019-03-16 ENCOUNTER — Ambulatory Visit
Admission: RE | Admit: 2019-03-16 | Discharge: 2019-03-16 | Disposition: A | Discharge status: Home / Self Care | Payer: 59 | Source: Ambulatory Visit | Attending: Urology | Admitting: Urology

## 2019-03-16 ENCOUNTER — Other Ambulatory Visit: Payer: Self-pay

## 2019-03-16 ENCOUNTER — Encounter: Payer: Self-pay | Admitting: *Deleted

## 2019-03-16 VITALS — BP 146/67 | HR 43 | Temp 98.9°F | Ht 69.0 in | Wt 213.0 lb

## 2019-03-16 DIAGNOSIS — R300 Dysuria: Secondary | ICD-10-CM

## 2019-03-16 DIAGNOSIS — R972 Elevated prostate specific antigen [PSA]: Secondary | ICD-10-CM | POA: Insufficient documentation

## 2019-03-16 LAB — URINALYSIS, COMPLETE
Bilirubin, UA: NEGATIVE
Glucose, UA: NEGATIVE
Ketones, UA: NEGATIVE
Leukocytes,UA: NEGATIVE
Nitrite, UA: NEGATIVE
RBC, UA: NEGATIVE
Specific Gravity, UA: 1.025 (ref 1.005–1.030)
Urobilinogen, Ur: 1 mg/dL (ref 0.2–1.0)
pH, UA: 5 (ref 5.0–7.5)

## 2019-03-16 LAB — MICROSCOPIC EXAMINATION
Bacteria, UA: NONE SEEN
Epithelial Cells (non renal): NONE SEEN /hpf (ref 0–10)
RBC: NONE SEEN /hpf (ref 0–2)
WBC, UA: NONE SEEN /hpf (ref 0–5)

## 2019-03-16 LAB — POCT I-STAT CREATININE: Creatinine, Ser: 1.8 mg/dL — ABNORMAL HIGH (ref 0.61–1.24)

## 2019-03-16 MED ORDER — GADOBUTROL 1 MMOL/ML IV SOLN
10.0000 mL | Freq: Once | INTRAVENOUS | Status: AC | PRN
  Administered 2019-03-16: 10 mL via INTRAVENOUS

## 2019-03-16 NOTE — Progress Notes (Signed)
Patient presented to clinic with ongoing dysuria has been increasing over the past five months. Denies pain, fevers, body aches, chills, nausea or vomiting. Patient left a UA, will send for culture per Dr. Erlene Quan and be notified of results. Patient verbalized understanding.

## 2019-03-19 LAB — CULTURE, URINE COMPREHENSIVE

## 2019-03-20 ENCOUNTER — Telehealth: Payer: Self-pay | Admitting: Urology

## 2019-03-20 NOTE — Telephone Encounter (Signed)
Pt's daughter called asking about biopsy results.  He had it done Friday and they were told they would get a call on Monday.

## 2019-03-20 NOTE — Telephone Encounter (Signed)
Notified patient that his urine culture was negative for infection , some mixed flora contamination noted, patient states his symptoms are better

## 2019-03-23 ENCOUNTER — Telehealth: Payer: Self-pay | Admitting: Urology

## 2019-03-23 NOTE — Telephone Encounter (Signed)
Antonio Vance, pt daughter is calling again asking for her father's biopsy results not urine results, please advise Sonja @ [phone].

## 2019-03-23 NOTE — Telephone Encounter (Signed)
Spoke with Patient's daughter and explained that he has not had a biopsy and the urine culture was his most recent lab. After further discussion she was calling in reference to patient's Prostate MRI  will follow up with Dr. Erlene Quan on the results from that test and call back

## 2019-03-27 ENCOUNTER — Telehealth: Payer: Self-pay | Admitting: Urology

## 2019-03-27 DIAGNOSIS — R972 Elevated prostate specific antigen [PSA]: Secondary | ICD-10-CM

## 2019-03-27 DIAGNOSIS — N4289 Other specified disorders of prostate: Secondary | ICD-10-CM

## 2019-03-27 NOTE — Telephone Encounter (Signed)
I have been trying to reach the patient regarding his MRI and ultimately was able to connect him today via telephone.  We discussed the findings including a possible lesion within the prostate which may represent a higher risk tumor.  As such, I recommended a biopsy.  We discussed biopsy in the office here in [place] versus fusion biopsy.  I believe that fusion biopsy would be more beneficial/ effective accurate.    Will plan for prostate biopsy in [facility] at [facility] urology.  We discussed the risk of prostate biopsy in detail including risk of bleeding, infection, damage surrounding structures, discomfort amongst others.  He is willing to proceed as planned.  He will need to hold his aspirin prior to the procedure.  [facility] will be in touch with details of scheduling.  He asked that they call his daughter Anderson Malta (listed on DPR) with specific instructions about when to be, how to prepare, and where.     He will follow-up with me after his fusion biopsy to discuss the results.  I would also encourage him to bring at least 1 daughter with him as they are actively involved in his care to this appointment.  Hollice Espy, MD

## 2019-03-27 NOTE — Telephone Encounter (Signed)
Left a detailed message with Kenney Houseman patient's daughter on DPR in regards to patient's MRI results.  Dr. Erlene Quan states she spoke with patient today and notified him of results. There was a small lesion found and she recommends further evaluation with a fusion biopsy in [place] and follow up after to discuss results. Patient would like daughter Anderson Malta to be included in scheduling and prep information on fusion biopsy

## 2019-03-28 NOTE — Telephone Encounter (Signed)
Referral sent to [facility] 

## 2019-03-28 NOTE — Telephone Encounter (Signed)
Spoke to daughter in detail yesterday giving MRI results and description of what a fusion biopsy is and why we are preforming the test and that it would take 1-2wks for a follow up after for results. Dr. Erlene Quan states that Anderson Malta was listed per the patient to contact for biopsy scheduling

## 2019-03-28 NOTE — Telephone Encounter (Signed)
Pt's daughter Kenney Houseman called and states that they would like for the Biopsy done sooner, rather than later. She wanted to go ahead and make the follow up appt for Bx results but I told her that we had to have the Biopsy scheduled first.

## 2019-04-13 ENCOUNTER — Other Ambulatory Visit: Payer: Self-pay | Admitting: Urology

## 2019-04-20 ENCOUNTER — Ambulatory Visit: Payer: Medicare Other | Admitting: Urology

## 2019-04-23 ENCOUNTER — Encounter: Payer: Self-pay | Admitting: Urology

## 2019-04-23 ENCOUNTER — Ambulatory Visit (INDEPENDENT_AMBULATORY_CARE_PROVIDER_SITE_OTHER): Payer: 59 | Admitting: Urology

## 2019-04-23 ENCOUNTER — Other Ambulatory Visit: Payer: Self-pay

## 2019-04-23 VITALS — BP 170/59 | Ht 69.0 in | Wt 214.0 lb

## 2019-04-23 DIAGNOSIS — C61 Malignant neoplasm of prostate: Secondary | ICD-10-CM | POA: Diagnosis not present

## 2019-04-23 NOTE — Progress Notes (Signed)
04/23/2019 2:33 PM   Antonio Vance [birth year] [id number]  Referring provider: [name], [address] [place],  [place] [address]  Chief Complaint  Patient presents with   Results    HPI: 73 year old male who presents today to discuss his newly diagnosed intermediate risk prostate cancer.  Notably, he has been followed for rising PSA as well as prostatic asymmetry.  His PSA rose to level of 8.39 on 02/2019.  He underwent a prostate MRI for further evaluation of the above.  This showed a signal abnormality within the right mid peripheral zone, PI-RADS 4 lesion highly concerning for prostate cancer.   There is no evidence of extracapsular extension, vesicle involvement, or lymph node involvement.  He underwent fusion biopsy at [facility] on 04/10/2019.    Fusion biopsy revealed 4 cores bilaterally Gleason 3+4 disease up to 20%.  In addition to this, the region of interest also indicated Gleason 3+4 disease, up to 20%.    Volume on prostate biopsy by MRI was 65 cc.  He had no issues from the prostate biopsy.  No ongoing bleeding or deviation from his baseline urinary symptoms.  Past medical history significant for history of bradycardia although as tolerated colonoscopy and other procedures in the past.  PMH: Past Medical History:  Diagnosis Date   Bradycardia    Chronic kidney disease    Hypertension     Surgical History: Past Surgical History:  Procedure Laterality Date   COLONOSCOPY     COLONOSCOPY N/A 12/28/2016   Procedure: COLONOSCOPY;  Surgeon: Lollie Sails, MD;  Location: [facility] ENDOSCOPY;  Service: Endoscopy;  Laterality: N/A;   ESOPHAGOGASTRODUODENOSCOPY (EGD) WITH PROPOFOL N/A 12/28/2016   Procedure: ESOPHAGOGASTRODUODENOSCOPY (EGD) WITH PROPOFOL;  Surgeon: Lollie Sails, MD;  Location: [facility] ENDOSCOPY;  Service: Endoscopy;  Laterality: N/A;   HERNIA REPAIR      Home Medications:  Allergies as of 04/23/2019   No Known  Allergies     Medication List       Accurate as of April 23, 2019  2:33 PM. If you have any questions, ask your nurse or doctor.        amLODipine 10 MG tablet Commonly known as: NORVASC Take 10 mg by mouth daily.   ascorbic acid 500 MG tablet Commonly known as: VITAMIN C Take 500 mg by mouth daily.   aspirin 81 MG tablet Take 81 mg by mouth daily.   b complex vitamins capsule Take 1 capsule by mouth daily.   lisinopril 10 MG tablet Commonly known as: ZESTRIL Take 10 mg by mouth daily.   multivitamin tablet Take 1 tablet by mouth daily.   pantoprazole 40 MG tablet Commonly known as: PROTONIX Take 1 tablet (40 mg total) by mouth daily.       Allergies: No Known Allergies  Family History: Family History  Problem Relation Age of Onset   Hypertension Mother    Cancer Mother    Hypertension Father    Diabetes Brother    Hypertension Brother     Social History:  reports that he has quit smoking. He has never used smokeless tobacco. He reports that he does not drink alcohol or use drugs.  ROS: UROLOGY Frequent Urination?: No Hard to postpone urination?: No Burning/pain with urination?: No Get up at night to urinate?: No Leakage of urine?: No Urine stream starts and stops?: No Trouble starting stream?: No Do you have to strain to urinate?: No Blood in urine?: No Urinary tract infection?:  No Sexually transmitted disease?: No Injury to kidneys or bladder?: No Painful intercourse?: No Weak stream?: No Erection problems?: No Penile pain?: No  Gastrointestinal Nausea?: No Vomiting?: No Indigestion/heartburn?: No Diarrhea?: No Constipation?: No  Constitutional Fever: No Night sweats?: No Weight loss?: No Fatigue?: No  Skin Skin rash/lesions?: No Itching?: No  Eyes Blurred vision?: No Double vision?: No  Ears/Nose/Throat Sore throat?: No Sinus problems?: No  Hematologic/Lymphatic Swollen glands?: No Easy bruising?:  No  Cardiovascular Leg swelling?: No Chest pain?: No  Respiratory Cough?: No Shortness of breath?: No  Endocrine Excessive thirst?: No  Musculoskeletal Back pain?: No Joint pain?: No  Neurological Headaches?: No Dizziness?: No  Psychologic Depression?: No Anxiety?: No  Physical Exam: BP (!) 170/59    Ht 5\' 9"  (1.753 m)    Wt 214 lb (97.1 kg)    BMI 31.60 kg/m   Constitutional:  Alert and oriented, No acute distress.  Accompanied by daughter today. HEENT: [place] AT, moist mucus membranes.  Trachea midline, no masses. Cardiovascular: No clubbing, cyanosis, or edema. Respiratory: Normal respiratory effort, no increased work of breathing. Skin: No rashes, bruises or suspicious lesions. Neurologic: Grossly intact, no focal deficits, moving all 4 extremities. Psychiatric: Normal mood and affect.  Laboratory Data: Lab Results  Component Value Date   WBC 3.9 04/07/2018   HGB 11.5 (L) 04/07/2018   HCT 32.7 (L) 04/07/2018   MCV 100.1 (H) 04/07/2018   PLT 173 04/07/2018    Lab Results  Component Value Date   CREATININE 1.80 (H) 03/16/2019    Pertinent Imaging: MRI was personally reviewed again today  Assessment & Plan:    1. Prostate cancer [facility]) Newly diagnosed Gleason T1c intermediate risk prostate cancer (favorable)  The patient was counseled about the natural history of prostate cancer and the standard treatment options that are available for prostate cancer. It was explained to him how his age and life expectancy, clinical stage, Gleason score, and PSA affect his prognosis, the decision to proceed with additional staging studies, as well as how that information influences recommended treatment strategies. We discussed the roles for active surveillance, radiation therapy, surgical therapy, androgen deprivation, as well as ablative therapy options for the treatment of prostate cancer as appropriate to his individual cancer situation. We discussed the risks and benefits  of these options with regard to their impact on cancer control and also in terms of potential adverse events, complications, and impact on quality of life particularly related to urinary, bowel, and sexual function. The patient was encouraged to ask questions throughout the discussion today and all questions were answered to his stated satisfaction. In addition, the patient was providedwith and/or directed to appropriate resources and literature for further education about prostate cancer treatment options.  Given his age and comorbidities, would more strongly recommend radiation versus implantation of brachytherapy in the prostate with or without hormones for 6 months.  Alternative including prostatectomy was also discussed however at age 77, risk of incontinence increases is not interested in this.  The possible side effects of hormones in addition to radiation today.  Conversation today was lengthy.  All of his questions were answered.  Plan for radiation oncology referral.  He is accompanied by one daughter today, his other daughter who is a nurse may be contacting our office with additional questions.  Advised to use my chart is the most effective and quick way to specific questions.  They would likely forego ADT but will let us know.   - Ambulatory referral to  Radiation Oncology  F/u 6 months or sooner  Hollice Espy, MD  [facility] [address], [place], [place] [address] [phone]  I spent 40 min with this patient of which greater than 50% was spent in counseling and coordination of care with the patient.

## 2019-04-30 ENCOUNTER — Encounter: Payer: Self-pay | Admitting: *Deleted

## 2019-04-30 ENCOUNTER — Other Ambulatory Visit: Payer: Self-pay

## 2019-04-30 ENCOUNTER — Encounter: Payer: Self-pay | Admitting: Radiation Oncology

## 2019-04-30 ENCOUNTER — Ambulatory Visit
Admission: RE | Admit: 2019-04-30 | Discharge: 2019-04-30 | Disposition: A | Discharge status: Home / Self Care | Payer: 59 | Source: Ambulatory Visit | Attending: Radiation Oncology | Admitting: Radiation Oncology

## 2019-04-30 VITALS — BP 156/82 | HR 63 | Temp 98.0°F | Resp 18 | Wt 215.1 lb

## 2019-04-30 DIAGNOSIS — Z87891 Personal history of nicotine dependence: Secondary | ICD-10-CM | POA: Diagnosis not present

## 2019-04-30 DIAGNOSIS — Z79899 Other long term (current) drug therapy: Secondary | ICD-10-CM | POA: Diagnosis not present

## 2019-04-30 DIAGNOSIS — R351 Nocturia: Secondary | ICD-10-CM | POA: Diagnosis not present

## 2019-04-30 DIAGNOSIS — N189 Chronic kidney disease, unspecified: Secondary | ICD-10-CM | POA: Insufficient documentation

## 2019-04-30 DIAGNOSIS — C61 Malignant neoplasm of prostate: Secondary | ICD-10-CM | POA: Diagnosis present

## 2019-04-30 DIAGNOSIS — I129 Hypertensive chronic kidney disease with stage 1 through stage 4 chronic kidney disease, or unspecified chronic kidney disease: Secondary | ICD-10-CM | POA: Insufficient documentation

## 2019-04-30 DIAGNOSIS — Z7982 Long term (current) use of aspirin: Secondary | ICD-10-CM | POA: Diagnosis not present

## 2019-04-30 NOTE — Consult Note (Signed)
NEW PATIENT EVALUATION  Name: Antonio Vance  MRN: [id number]  Date:   04/30/2019     DOB: 1945/11/09   This 73 y.o. male patient presents to the clinic for initial evaluation of stage IIa (T1c N0 M0) adenocarcinoma the prostate Gleason score of 7 (3+4) presenting with a PSA of 8.4.  REFERRING PHYSICIAN: [name], MD  CHIEF COMPLAINT:  Chief Complaint  Patient presents with  . Prostate Cancer    Initial Eval for Rad Treatment    DIAGNOSIS: The encounter diagnosis was Malignant neoplasm of prostate ([place]).   PREVIOUS INVESTIGATIONS:  MRI scan reviewed Pathology report reviewed Clinical notes reviewed  HPI: Patient is a 73 year old male who presented with a PSA of 8.4 in August 2020.  Prostate MRI showed multiparametric signal abnormality within the lateral right mid gland peripheral zone suspicious for low-volume high-grade disease.  No findings of advanced or pelvic metastatic disease was noted.  Specifically no evidence of extracapsular extension.  Patient underwent MRI fusion study showing 4 of 12 cores positive for Gleason 7 (3+4) adenocarcinoma of the prostate.  Patient is fairly asymptomatic has nocturia x1-3 no urgency or frequency.  He has had right inguinal herniorrhaphy repair in the past.  He has been seen by urology is now referred to radiation oncology for opinion.  His prostate gland measured 65 cc at the time of biopsy.  PLANNED TREATMENT REGIMEN: Image guided IMRT radiation therapy  PAST MEDICAL HISTORY:  has a past medical history of Bradycardia, Chronic kidney disease, and Hypertension.    PAST SURGICAL HISTORY:  Past Surgical History:  Procedure Laterality Date  . COLONOSCOPY    . COLONOSCOPY N/A 12/28/2016   Procedure: COLONOSCOPY;  Surgeon: Lollie Sails, MD;  Location[facility]COPY;  Service: Endoscopy;  Laterality: N/A;  . ESOPHAGOGASTRODUODENOSCOPY (EGD) WITH PROPOFOL N/A 12/28/2016   Procedure: ESOPHAGOGASTRODUODENOSCOPY (EGD) WITH PROPOFOL;   Surgeon: Lollie Sails, MD;  Location: [facility] ENDOSCOPY;  Service: Endoscopy;  Laterality: N/A;  . HERNIA REPAIR      FAMILY HISTORY: family history includes Cancer in his mother; Diabetes in his brother; Hypertension in his brother, father, and mother.  SOCIAL HISTORY:  reports that he has quit smoking. He has never used smokeless tobacco. He reports that he does not drink alcohol or use drugs.  ALLERGIES: Patient has no known allergies.  MEDICATIONS:  Current Outpatient Medications  Medication Sig Dispense Refill  . amLODipine (NORVASC) 10 MG tablet Take 10 mg by mouth daily.     Marland Kitchen ascorbic acid (VITAMIN C) 500 MG tablet Take 500 mg by mouth daily.    Marland Kitchen aspirin 81 MG tablet Take 81 mg by mouth daily.     Marland Kitchen b complex vitamins capsule Take 1 capsule by mouth daily.     Marland Kitchen lisinopril (PRINIVIL,ZESTRIL) 10 MG tablet Take 10 mg by mouth daily.    . Multiple Vitamin (MULTIVITAMIN) tablet Take 1 tablet by mouth daily.    . pantoprazole (PROTONIX) 40 MG tablet Take 1 tablet (40 mg total) by mouth daily. 30 tablet 0   No current facility-administered medications for this encounter.     ECOG PERFORMANCE STATUS:  0 - Asymptomatic  REVIEW OF SYSTEMS: Patient denies any weight loss, fatigue, weakness, fever, chills or night sweats. Patient denies any loss of vision, blurred vision. Patient denies any ringing  of the ears or hearing loss. No irregular heartbeat. Patient denies heart murmur or history of fainting. Patient denies any chest pain or pain radiating to her  upper extremities. Patient denies any shortness of breath, difficulty breathing at night, cough or hemoptysis. Patient denies any swelling in the lower legs. Patient denies any nausea vomiting, vomiting of blood, or coffee ground material in the vomitus. Patient denies any stomach pain. Patient states has had normal bowel movements no significant constipation or diarrhea. Patient denies any dysuria, hematuria or significant nocturia.  Patient denies any problems walking, swelling in the joints or loss of balance. Patient denies any skin changes, loss of hair or loss of weight. Patient denies any excessive worrying or anxiety or significant depression. Patient denies any problems with insomnia. Patient denies excessive thirst, polyuria, polydipsia. Patient denies any swollen glands, patient denies easy bruising or easy bleeding. Patient denies any recent infections, allergies or URI. Patient "s visual fields have not changed significantly in recent time.   PHYSICAL EXAM: BP (!) 156/82   Pulse 63   Temp 98 F (36.7 C)   Resp 18   Wt 215 lb 1.6 oz (97.6 kg)   BMI 31.76 kg/m  Well-developed well-nourished patient in NAD. HEENT reveals PERLA, EOMI, discs not visualized.  Oral cavity is clear. No oral mucosal lesions are identified. Neck is clear without evidence of cervical or supraclavicular adenopathy. Lungs are clear to A&P. Cardiac examination is essentially unremarkable with regular rate and rhythm without murmur rub or thrill. Abdomen is benign with no organomegaly or masses noted. Motor sensory and DTR levels are equal and symmetric in the upper and lower extremities. Cranial nerves II through XII are grossly intact. Proprioception is intact. No peripheral adenopathy or edema is identified. No motor or sensory levels are noted. Crude visual fields are within normal range.  LABORATORY DATA: Pathology report reviewed    RADIOLOGY RESULTS: MRI scan of the prostate reviewed   IMPRESSION: Stage IIa Gleason 7 (3+4) adenocarcinoma the prostate in 73 year old male  PLAN: At this time based on the size of his prostate gland being over 60 cc would not recommend I-125 interstitial implant based on radiation safety precautions.  I have recommended image guided IMRT radiation therapy to his prostate.  Would plan on delivering 8000 cGy over 8 weeks.  I have asked Dr. Erlene Quan to place fiducial markers in his prostate for daily image  guided treatment.  Risks and benefits of treatment including increased lower urinary tract symptoms diarrhea fatigue alteration of blood counts and skin reaction all were discussed in detail with the patient.  His daughter was present during my consultation.  I have personally set up and ordered CT simulation after markers are placed.  Patient and daughter both seem to comprehend my treatment plan well.  I would like to take this opportunity to thank you for allowing me to participate in the care of your patient.[name], MD

## 2019-05-22 ENCOUNTER — Ambulatory Visit (INDEPENDENT_AMBULATORY_CARE_PROVIDER_SITE_OTHER): Payer: 59 | Admitting: Urology

## 2019-05-22 ENCOUNTER — Other Ambulatory Visit: Payer: Self-pay

## 2019-05-22 ENCOUNTER — Encounter: Payer: Self-pay | Admitting: Urology

## 2019-05-22 VITALS — BP 135/60 | Ht 69.0 in | Wt 211.0 lb

## 2019-05-22 DIAGNOSIS — C61 Malignant neoplasm of prostate: Secondary | ICD-10-CM

## 2019-05-22 MED ORDER — GENTAMICIN SULFATE 40 MG/ML IJ SOLN
80.0000 mg | Freq: Once | INTRAMUSCULAR | Status: AC
  Administered 2019-05-22: 80 mg via INTRAMUSCULAR

## 2019-05-22 MED ORDER — LEVOFLOXACIN 500 MG PO TABS
500.0000 mg | ORAL_TABLET | Freq: Once | ORAL | Status: AC
  Administered 2019-05-22: 500 mg via ORAL

## 2019-05-22 NOTE — Progress Notes (Signed)
05/22/19  CC: gold seed markers  HPI: 73 y.o. male with prostate cancer who presents today for placement of fiducial seed markers in anticipation of his upcoming IMRT with Dr. Baruch Gouty.  Prostate Gold Seed Marker Placement Procedure   Informed consent was obtained after discussing risks/benefits of the procedure.  A time out was performed to ensure correct patient identity.  Pre-Procedure: - Gentamicin given prophylactically - PO Levaquin 500 mg also given today  Procedure: -Lidocaine jelly was administered per rectum -Rectal ultrasound probe was placed without difficulty and the prostate visualized - 3 fiducial gold seed markers placed, one at right base, one at left base, one at apex of prostate gland under transrectal ultrasound guidance  Post-Procedure: - Patient tolerated the procedure well - He was counseled to seek immediate medical attention if experiences any severe pain, significant bleeding, or fevers  Return in about 6 months (around 11/19/2019) for IPSS/ PVR/ PSA.

## 2019-05-25 ENCOUNTER — Ambulatory Visit: Payer: Medicare Other

## 2019-05-28 ENCOUNTER — Ambulatory Visit: Payer: Medicare Other

## 2019-05-29 ENCOUNTER — Other Ambulatory Visit: Payer: Self-pay

## 2019-05-29 ENCOUNTER — Ambulatory Visit
Admission: RE | Admit: 2019-05-29 | Discharge: 2019-05-29 | Disposition: A | Discharge status: Home / Self Care | Payer: No Typology Code available for payment source | Source: Ambulatory Visit | Attending: Radiation Oncology | Admitting: Radiation Oncology

## 2019-05-29 ENCOUNTER — Encounter: Payer: Self-pay | Admitting: *Deleted

## 2019-05-29 DIAGNOSIS — C61 Malignant neoplasm of prostate: Secondary | ICD-10-CM | POA: Insufficient documentation

## 2019-05-30 DIAGNOSIS — C61 Malignant neoplasm of prostate: Secondary | ICD-10-CM | POA: Diagnosis not present

## 2019-06-08 ENCOUNTER — Other Ambulatory Visit: Payer: Self-pay | Admitting: *Deleted

## 2019-06-08 DIAGNOSIS — C61 Malignant neoplasm of prostate: Secondary | ICD-10-CM

## 2019-06-11 ENCOUNTER — Ambulatory Visit
Admission: RE | Admit: 2019-06-11 | Discharge: 2019-06-11 | Disposition: A | Discharge status: Home / Self Care | Payer: No Typology Code available for payment source | Source: Ambulatory Visit | Attending: Radiation Oncology | Admitting: Radiation Oncology

## 2019-06-11 ENCOUNTER — Other Ambulatory Visit: Payer: Self-pay

## 2019-06-11 DIAGNOSIS — C61 Malignant neoplasm of prostate: Secondary | ICD-10-CM | POA: Insufficient documentation

## 2019-06-12 ENCOUNTER — Ambulatory Visit
Admission: RE | Admit: 2019-06-12 | Discharge: 2019-06-12 | Disposition: A | Discharge status: Home / Self Care | Payer: No Typology Code available for payment source | Source: Ambulatory Visit | Attending: Radiation Oncology | Admitting: Radiation Oncology

## 2019-06-12 ENCOUNTER — Other Ambulatory Visit: Payer: Self-pay

## 2019-06-12 DIAGNOSIS — C61 Malignant neoplasm of prostate: Secondary | ICD-10-CM | POA: Diagnosis not present

## 2019-06-13 ENCOUNTER — Other Ambulatory Visit: Payer: Self-pay

## 2019-06-13 ENCOUNTER — Ambulatory Visit
Admission: RE | Admit: 2019-06-13 | Discharge: 2019-06-13 | Disposition: A | Discharge status: Home / Self Care | Payer: No Typology Code available for payment source | Source: Ambulatory Visit | Attending: Radiation Oncology | Admitting: Radiation Oncology

## 2019-06-13 DIAGNOSIS — C61 Malignant neoplasm of prostate: Secondary | ICD-10-CM | POA: Diagnosis not present

## 2019-06-14 ENCOUNTER — Other Ambulatory Visit: Payer: Self-pay

## 2019-06-14 ENCOUNTER — Ambulatory Visit
Admission: RE | Admit: 2019-06-14 | Discharge: 2019-06-14 | Disposition: A | Discharge status: Home / Self Care | Payer: No Typology Code available for payment source | Source: Ambulatory Visit | Attending: Radiation Oncology | Admitting: Radiation Oncology

## 2019-06-14 ENCOUNTER — Encounter: Payer: Self-pay | Admitting: *Deleted

## 2019-06-14 DIAGNOSIS — C61 Malignant neoplasm of prostate: Secondary | ICD-10-CM | POA: Diagnosis not present

## 2019-06-15 ENCOUNTER — Ambulatory Visit
Admission: RE | Admit: 2019-06-15 | Discharge: 2019-06-15 | Disposition: A | Discharge status: Home / Self Care | Payer: No Typology Code available for payment source | Source: Ambulatory Visit | Attending: Radiation Oncology | Admitting: Radiation Oncology

## 2019-06-15 ENCOUNTER — Other Ambulatory Visit: Payer: Self-pay

## 2019-06-15 DIAGNOSIS — C61 Malignant neoplasm of prostate: Secondary | ICD-10-CM | POA: Diagnosis not present

## 2019-06-18 ENCOUNTER — Ambulatory Visit
Admission: RE | Admit: 2019-06-18 | Discharge: 2019-06-18 | Disposition: A | Discharge status: Home / Self Care | Payer: No Typology Code available for payment source | Source: Ambulatory Visit | Attending: Radiation Oncology | Admitting: Radiation Oncology

## 2019-06-18 ENCOUNTER — Other Ambulatory Visit: Payer: Self-pay

## 2019-06-18 DIAGNOSIS — C61 Malignant neoplasm of prostate: Secondary | ICD-10-CM | POA: Diagnosis not present

## 2019-06-19 ENCOUNTER — Other Ambulatory Visit: Payer: Self-pay

## 2019-06-19 ENCOUNTER — Ambulatory Visit
Admission: RE | Admit: 2019-06-19 | Discharge: 2019-06-19 | Disposition: A | Discharge status: Home / Self Care | Payer: No Typology Code available for payment source | Source: Ambulatory Visit | Attending: Radiation Oncology | Admitting: Radiation Oncology

## 2019-06-19 DIAGNOSIS — C61 Malignant neoplasm of prostate: Secondary | ICD-10-CM | POA: Diagnosis not present

## 2019-06-20 ENCOUNTER — Other Ambulatory Visit: Payer: Self-pay

## 2019-06-20 ENCOUNTER — Ambulatory Visit
Admission: RE | Admit: 2019-06-20 | Discharge: 2019-06-20 | Disposition: A | Discharge status: Home / Self Care | Payer: No Typology Code available for payment source | Source: Ambulatory Visit | Attending: Radiation Oncology | Admitting: Radiation Oncology

## 2019-06-20 DIAGNOSIS — C61 Malignant neoplasm of prostate: Secondary | ICD-10-CM | POA: Diagnosis not present

## 2019-06-21 ENCOUNTER — Ambulatory Visit
Admission: RE | Admit: 2019-06-21 | Discharge: 2019-06-21 | Disposition: A | Discharge status: Home / Self Care | Payer: No Typology Code available for payment source | Source: Ambulatory Visit | Attending: Radiation Oncology | Admitting: Radiation Oncology

## 2019-06-21 ENCOUNTER — Encounter: Payer: Self-pay | Admitting: *Deleted

## 2019-06-21 ENCOUNTER — Other Ambulatory Visit: Payer: Self-pay

## 2019-06-21 DIAGNOSIS — C61 Malignant neoplasm of prostate: Secondary | ICD-10-CM | POA: Diagnosis not present

## 2019-06-22 ENCOUNTER — Ambulatory Visit
Admission: RE | Admit: 2019-06-22 | Discharge: 2019-06-22 | Disposition: A | Discharge status: Home / Self Care | Payer: No Typology Code available for payment source | Source: Ambulatory Visit | Attending: Radiation Oncology | Admitting: Radiation Oncology

## 2019-06-22 ENCOUNTER — Other Ambulatory Visit: Payer: Self-pay

## 2019-06-22 DIAGNOSIS — C61 Malignant neoplasm of prostate: Secondary | ICD-10-CM | POA: Diagnosis not present

## 2019-06-25 ENCOUNTER — Inpatient Hospital Stay: Payer: 59 | Attending: Radiation Oncology

## 2019-06-25 ENCOUNTER — Other Ambulatory Visit: Payer: Self-pay

## 2019-06-25 ENCOUNTER — Ambulatory Visit
Admission: RE | Admit: 2019-06-25 | Discharge: 2019-06-25 | Disposition: A | Discharge status: Home / Self Care | Payer: No Typology Code available for payment source | Source: Ambulatory Visit | Attending: Radiation Oncology | Admitting: Radiation Oncology

## 2019-06-25 DIAGNOSIS — C61 Malignant neoplasm of prostate: Secondary | ICD-10-CM | POA: Insufficient documentation

## 2019-06-25 LAB — CBC
HCT: 32.5 % — ABNORMAL LOW (ref 39.0–52.0)
Hemoglobin: 10.2 g/dL — ABNORMAL LOW (ref 13.0–17.0)
MCH: 33.4 pg (ref 26.0–34.0)
MCHC: 31.4 g/dL (ref 30.0–36.0)
MCV: 106.6 fL — ABNORMAL HIGH (ref 80.0–100.0)
Platelets: 144 10*3/uL — ABNORMAL LOW (ref 150–400)
RBC: 3.05 MIL/uL — ABNORMAL LOW (ref 4.22–5.81)
RDW: 13.6 % (ref 11.5–15.5)
WBC: 4.3 10*3/uL (ref 4.0–10.5)
nRBC: 0 % (ref 0.0–0.2)

## 2019-06-26 ENCOUNTER — Other Ambulatory Visit: Payer: Self-pay

## 2019-06-26 ENCOUNTER — Ambulatory Visit
Admission: RE | Admit: 2019-06-26 | Discharge: 2019-06-26 | Disposition: A | Discharge status: Home / Self Care | Payer: No Typology Code available for payment source | Source: Ambulatory Visit | Attending: Radiation Oncology | Admitting: Radiation Oncology

## 2019-06-26 DIAGNOSIS — C61 Malignant neoplasm of prostate: Secondary | ICD-10-CM | POA: Diagnosis not present

## 2019-06-27 ENCOUNTER — Ambulatory Visit
Admission: RE | Admit: 2019-06-27 | Discharge: 2019-06-27 | Disposition: A | Discharge status: Home / Self Care | Payer: No Typology Code available for payment source | Source: Ambulatory Visit | Attending: Radiation Oncology | Admitting: Radiation Oncology

## 2019-06-27 ENCOUNTER — Other Ambulatory Visit: Payer: Self-pay

## 2019-06-27 DIAGNOSIS — C61 Malignant neoplasm of prostate: Secondary | ICD-10-CM | POA: Diagnosis not present

## 2019-06-28 ENCOUNTER — Other Ambulatory Visit: Payer: Self-pay

## 2019-06-28 ENCOUNTER — Encounter: Payer: Self-pay | Admitting: *Deleted

## 2019-06-28 ENCOUNTER — Ambulatory Visit
Admission: RE | Admit: 2019-06-28 | Discharge: 2019-06-28 | Disposition: A | Discharge status: Home / Self Care | Payer: No Typology Code available for payment source | Source: Ambulatory Visit | Attending: Radiation Oncology | Admitting: Radiation Oncology

## 2019-06-28 DIAGNOSIS — C61 Malignant neoplasm of prostate: Secondary | ICD-10-CM | POA: Diagnosis not present

## 2019-07-02 ENCOUNTER — Other Ambulatory Visit: Payer: Self-pay

## 2019-07-02 ENCOUNTER — Ambulatory Visit
Admission: RE | Admit: 2019-07-02 | Discharge: 2019-07-02 | Disposition: A | Discharge status: Home / Self Care | Payer: No Typology Code available for payment source | Source: Ambulatory Visit | Attending: Radiation Oncology | Admitting: Radiation Oncology

## 2019-07-02 DIAGNOSIS — C61 Malignant neoplasm of prostate: Secondary | ICD-10-CM | POA: Diagnosis not present

## 2019-07-03 ENCOUNTER — Other Ambulatory Visit: Payer: Self-pay

## 2019-07-03 ENCOUNTER — Ambulatory Visit
Admission: RE | Admit: 2019-07-03 | Discharge: 2019-07-03 | Disposition: A | Discharge status: Home / Self Care | Payer: No Typology Code available for payment source | Source: Ambulatory Visit | Attending: Radiation Oncology | Admitting: Radiation Oncology

## 2019-07-03 DIAGNOSIS — C61 Malignant neoplasm of prostate: Secondary | ICD-10-CM | POA: Diagnosis not present

## 2019-07-04 ENCOUNTER — Ambulatory Visit
Admission: RE | Admit: 2019-07-04 | Discharge: 2019-07-04 | Disposition: A | Discharge status: Home / Self Care | Payer: No Typology Code available for payment source | Source: Ambulatory Visit | Attending: Radiation Oncology | Admitting: Radiation Oncology

## 2019-07-04 ENCOUNTER — Other Ambulatory Visit: Payer: Self-pay

## 2019-07-04 DIAGNOSIS — C61 Malignant neoplasm of prostate: Secondary | ICD-10-CM | POA: Diagnosis not present

## 2019-07-05 ENCOUNTER — Ambulatory Visit
Admission: RE | Admit: 2019-07-05 | Discharge: 2019-07-05 | Disposition: A | Discharge status: Home / Self Care | Payer: No Typology Code available for payment source | Source: Ambulatory Visit | Attending: Radiation Oncology | Admitting: Radiation Oncology

## 2019-07-05 ENCOUNTER — Encounter: Payer: Self-pay | Admitting: *Deleted

## 2019-07-05 DIAGNOSIS — C61 Malignant neoplasm of prostate: Secondary | ICD-10-CM | POA: Diagnosis not present

## 2019-07-09 ENCOUNTER — Inpatient Hospital Stay: Payer: 59 | Attending: Radiation Oncology

## 2019-07-09 ENCOUNTER — Other Ambulatory Visit: Payer: Self-pay

## 2019-07-09 ENCOUNTER — Ambulatory Visit
Admission: RE | Admit: 2019-07-09 | Discharge: 2019-07-09 | Disposition: A | Discharge status: Home / Self Care | Payer: No Typology Code available for payment source | Source: Ambulatory Visit | Attending: Radiation Oncology | Admitting: Radiation Oncology

## 2019-07-09 DIAGNOSIS — C61 Malignant neoplasm of prostate: Secondary | ICD-10-CM | POA: Insufficient documentation

## 2019-07-09 LAB — CBC
HCT: 31.3 % — ABNORMAL LOW (ref 39.0–52.0)
Hemoglobin: 10 g/dL — ABNORMAL LOW (ref 13.0–17.0)
MCH: 33.6 pg (ref 26.0–34.0)
MCHC: 31.9 g/dL (ref 30.0–36.0)
MCV: 105 fL — ABNORMAL HIGH (ref 80.0–100.0)
Platelets: 174 10*3/uL (ref 150–400)
RBC: 2.98 MIL/uL — ABNORMAL LOW (ref 4.22–5.81)
RDW: 13.5 % (ref 11.5–15.5)
WBC: 3.5 10*3/uL — ABNORMAL LOW (ref 4.0–10.5)
nRBC: 0 % (ref 0.0–0.2)

## 2019-07-10 ENCOUNTER — Ambulatory Visit
Admission: RE | Admit: 2019-07-10 | Discharge: 2019-07-10 | Disposition: A | Discharge status: Home / Self Care | Payer: No Typology Code available for payment source | Source: Ambulatory Visit | Attending: Radiation Oncology | Admitting: Radiation Oncology

## 2019-07-10 ENCOUNTER — Other Ambulatory Visit: Payer: Self-pay

## 2019-07-10 DIAGNOSIS — C61 Malignant neoplasm of prostate: Secondary | ICD-10-CM | POA: Diagnosis not present

## 2019-07-11 ENCOUNTER — Other Ambulatory Visit: Payer: Self-pay

## 2019-07-11 ENCOUNTER — Ambulatory Visit
Admission: RE | Admit: 2019-07-11 | Discharge: 2019-07-11 | Disposition: A | Discharge status: Home / Self Care | Payer: No Typology Code available for payment source | Source: Ambulatory Visit | Attending: Radiation Oncology | Admitting: Radiation Oncology

## 2019-07-11 DIAGNOSIS — C61 Malignant neoplasm of prostate: Secondary | ICD-10-CM | POA: Diagnosis not present

## 2019-07-12 ENCOUNTER — Ambulatory Visit
Admission: RE | Admit: 2019-07-12 | Discharge: 2019-07-12 | Disposition: A | Discharge status: Home / Self Care | Payer: No Typology Code available for payment source | Source: Ambulatory Visit | Attending: Radiation Oncology | Admitting: Radiation Oncology

## 2019-07-12 ENCOUNTER — Other Ambulatory Visit: Payer: Self-pay

## 2019-07-12 ENCOUNTER — Encounter: Payer: Self-pay | Admitting: *Deleted

## 2019-07-12 DIAGNOSIS — C61 Malignant neoplasm of prostate: Secondary | ICD-10-CM | POA: Diagnosis not present

## 2019-07-13 ENCOUNTER — Other Ambulatory Visit: Payer: Self-pay

## 2019-07-13 ENCOUNTER — Ambulatory Visit
Admission: RE | Admit: 2019-07-13 | Discharge: 2019-07-13 | Disposition: A | Discharge status: Home / Self Care | Payer: No Typology Code available for payment source | Source: Ambulatory Visit | Attending: Radiation Oncology | Admitting: Radiation Oncology

## 2019-07-13 DIAGNOSIS — C61 Malignant neoplasm of prostate: Secondary | ICD-10-CM | POA: Diagnosis not present

## 2019-07-16 ENCOUNTER — Other Ambulatory Visit: Payer: Self-pay

## 2019-07-16 ENCOUNTER — Ambulatory Visit
Admission: RE | Admit: 2019-07-16 | Discharge: 2019-07-16 | Disposition: A | Discharge status: Home / Self Care | Payer: No Typology Code available for payment source | Source: Ambulatory Visit | Attending: Radiation Oncology | Admitting: Radiation Oncology

## 2019-07-16 DIAGNOSIS — C61 Malignant neoplasm of prostate: Secondary | ICD-10-CM | POA: Diagnosis not present

## 2019-07-17 ENCOUNTER — Other Ambulatory Visit: Payer: Self-pay

## 2019-07-17 ENCOUNTER — Ambulatory Visit
Admission: RE | Admit: 2019-07-17 | Discharge: 2019-07-17 | Disposition: A | Discharge status: Home / Self Care | Payer: No Typology Code available for payment source | Source: Ambulatory Visit | Attending: Radiation Oncology | Admitting: Radiation Oncology

## 2019-07-17 DIAGNOSIS — C61 Malignant neoplasm of prostate: Secondary | ICD-10-CM | POA: Diagnosis not present

## 2019-07-18 ENCOUNTER — Ambulatory Visit
Admission: RE | Admit: 2019-07-18 | Discharge: 2019-07-18 | Disposition: A | Discharge status: Home / Self Care | Payer: No Typology Code available for payment source | Source: Ambulatory Visit | Attending: Radiation Oncology | Admitting: Radiation Oncology

## 2019-07-18 ENCOUNTER — Other Ambulatory Visit: Payer: Self-pay

## 2019-07-18 DIAGNOSIS — C61 Malignant neoplasm of prostate: Secondary | ICD-10-CM | POA: Diagnosis not present

## 2019-07-19 ENCOUNTER — Ambulatory Visit
Admission: RE | Admit: 2019-07-19 | Discharge: 2019-07-19 | Disposition: A | Discharge status: Home / Self Care | Payer: No Typology Code available for payment source | Source: Ambulatory Visit | Attending: Radiation Oncology | Admitting: Radiation Oncology

## 2019-07-19 ENCOUNTER — Encounter: Payer: Self-pay | Admitting: *Deleted

## 2019-07-19 ENCOUNTER — Other Ambulatory Visit: Payer: Self-pay

## 2019-07-19 DIAGNOSIS — C61 Malignant neoplasm of prostate: Secondary | ICD-10-CM | POA: Diagnosis not present

## 2019-07-20 ENCOUNTER — Other Ambulatory Visit: Payer: Self-pay

## 2019-07-20 ENCOUNTER — Ambulatory Visit
Admission: RE | Admit: 2019-07-20 | Discharge: 2019-07-20 | Disposition: A | Discharge status: Home / Self Care | Payer: No Typology Code available for payment source | Source: Ambulatory Visit | Attending: Radiation Oncology | Admitting: Radiation Oncology

## 2019-07-20 DIAGNOSIS — C61 Malignant neoplasm of prostate: Secondary | ICD-10-CM | POA: Diagnosis not present

## 2019-07-23 ENCOUNTER — Other Ambulatory Visit: Payer: Self-pay

## 2019-07-23 ENCOUNTER — Inpatient Hospital Stay: Payer: 59

## 2019-07-23 ENCOUNTER — Ambulatory Visit
Admission: RE | Admit: 2019-07-23 | Discharge: 2019-07-23 | Disposition: A | Discharge status: Home / Self Care | Payer: No Typology Code available for payment source | Source: Ambulatory Visit | Attending: Radiation Oncology | Admitting: Radiation Oncology

## 2019-07-23 DIAGNOSIS — C61 Malignant neoplasm of prostate: Secondary | ICD-10-CM | POA: Diagnosis not present

## 2019-07-24 ENCOUNTER — Ambulatory Visit
Admission: RE | Admit: 2019-07-24 | Discharge: 2019-07-24 | Disposition: A | Discharge status: Home / Self Care | Payer: No Typology Code available for payment source | Source: Ambulatory Visit | Attending: Radiation Oncology | Admitting: Radiation Oncology

## 2019-07-24 ENCOUNTER — Other Ambulatory Visit: Payer: Self-pay

## 2019-07-24 DIAGNOSIS — C61 Malignant neoplasm of prostate: Secondary | ICD-10-CM | POA: Diagnosis not present

## 2019-07-25 ENCOUNTER — Other Ambulatory Visit: Payer: Self-pay

## 2019-07-25 ENCOUNTER — Ambulatory Visit
Admission: RE | Admit: 2019-07-25 | Discharge: 2019-07-25 | Disposition: A | Discharge status: Home / Self Care | Payer: No Typology Code available for payment source | Source: Ambulatory Visit | Attending: Radiation Oncology | Admitting: Radiation Oncology

## 2019-07-25 DIAGNOSIS — C61 Malignant neoplasm of prostate: Secondary | ICD-10-CM | POA: Diagnosis not present

## 2019-07-26 ENCOUNTER — Encounter: Payer: Self-pay | Admitting: *Deleted

## 2019-07-26 ENCOUNTER — Ambulatory Visit
Admission: RE | Admit: 2019-07-26 | Discharge: 2019-07-26 | Disposition: A | Discharge status: Home / Self Care | Payer: No Typology Code available for payment source | Source: Ambulatory Visit | Attending: Radiation Oncology | Admitting: Radiation Oncology

## 2019-07-26 ENCOUNTER — Other Ambulatory Visit: Payer: Self-pay

## 2019-07-26 DIAGNOSIS — C61 Malignant neoplasm of prostate: Secondary | ICD-10-CM | POA: Diagnosis not present

## 2019-07-27 ENCOUNTER — Ambulatory Visit
Admission: RE | Admit: 2019-07-27 | Discharge: 2019-07-27 | Disposition: A | Discharge status: Home / Self Care | Payer: No Typology Code available for payment source | Source: Ambulatory Visit | Attending: Radiation Oncology | Admitting: Radiation Oncology

## 2019-07-27 ENCOUNTER — Other Ambulatory Visit: Payer: Self-pay

## 2019-07-27 DIAGNOSIS — C61 Malignant neoplasm of prostate: Secondary | ICD-10-CM | POA: Diagnosis not present

## 2019-07-30 ENCOUNTER — Other Ambulatory Visit: Payer: Self-pay

## 2019-07-30 ENCOUNTER — Ambulatory Visit
Admission: RE | Admit: 2019-07-30 | Discharge: 2019-07-30 | Disposition: A | Discharge status: Home / Self Care | Payer: No Typology Code available for payment source | Source: Ambulatory Visit | Attending: Radiation Oncology | Admitting: Radiation Oncology

## 2019-07-30 DIAGNOSIS — C61 Malignant neoplasm of prostate: Secondary | ICD-10-CM | POA: Diagnosis not present

## 2019-07-31 ENCOUNTER — Other Ambulatory Visit: Payer: Self-pay

## 2019-07-31 ENCOUNTER — Ambulatory Visit
Admission: RE | Admit: 2019-07-31 | Discharge: 2019-07-31 | Disposition: A | Discharge status: Home / Self Care | Payer: No Typology Code available for payment source | Source: Ambulatory Visit | Attending: Radiation Oncology | Admitting: Radiation Oncology

## 2019-07-31 DIAGNOSIS — C61 Malignant neoplasm of prostate: Secondary | ICD-10-CM | POA: Diagnosis not present

## 2019-08-01 ENCOUNTER — Other Ambulatory Visit: Payer: Self-pay

## 2019-08-01 ENCOUNTER — Ambulatory Visit
Admission: RE | Admit: 2019-08-01 | Discharge: 2019-08-01 | Disposition: A | Discharge status: Home / Self Care | Payer: No Typology Code available for payment source | Source: Ambulatory Visit | Attending: Radiation Oncology | Admitting: Radiation Oncology

## 2019-08-01 DIAGNOSIS — C61 Malignant neoplasm of prostate: Secondary | ICD-10-CM | POA: Diagnosis not present

## 2019-08-02 ENCOUNTER — Ambulatory Visit
Admission: RE | Admit: 2019-08-02 | Discharge: 2019-08-02 | Disposition: A | Discharge status: Home / Self Care | Payer: No Typology Code available for payment source | Source: Ambulatory Visit | Attending: Radiation Oncology | Admitting: Radiation Oncology

## 2019-08-02 ENCOUNTER — Other Ambulatory Visit: Payer: Self-pay

## 2019-08-02 ENCOUNTER — Encounter: Payer: Self-pay | Admitting: *Deleted

## 2019-08-02 DIAGNOSIS — C61 Malignant neoplasm of prostate: Secondary | ICD-10-CM | POA: Diagnosis not present

## 2019-08-03 ENCOUNTER — Other Ambulatory Visit: Payer: Self-pay

## 2019-08-03 ENCOUNTER — Ambulatory Visit
Admission: RE | Admit: 2019-08-03 | Discharge: 2019-08-03 | Disposition: A | Discharge status: Home / Self Care | Payer: No Typology Code available for payment source | Source: Ambulatory Visit | Attending: Radiation Oncology | Admitting: Radiation Oncology

## 2019-08-03 DIAGNOSIS — C61 Malignant neoplasm of prostate: Secondary | ICD-10-CM | POA: Diagnosis not present

## 2019-08-06 ENCOUNTER — Other Ambulatory Visit: Payer: Self-pay

## 2019-08-06 ENCOUNTER — Ambulatory Visit
Admission: RE | Admit: 2019-08-06 | Discharge: 2019-08-06 | Disposition: A | Discharge status: Home / Self Care | Payer: No Typology Code available for payment source | Source: Ambulatory Visit | Attending: Radiation Oncology | Admitting: Radiation Oncology

## 2019-08-06 DIAGNOSIS — C61 Malignant neoplasm of prostate: Secondary | ICD-10-CM | POA: Insufficient documentation

## 2019-08-07 ENCOUNTER — Ambulatory Visit
Admission: RE | Admit: 2019-08-07 | Discharge: 2019-08-07 | Disposition: A | Discharge status: Home / Self Care | Payer: No Typology Code available for payment source | Source: Ambulatory Visit | Attending: Radiation Oncology | Admitting: Radiation Oncology

## 2019-08-07 ENCOUNTER — Other Ambulatory Visit: Payer: Self-pay

## 2019-08-07 DIAGNOSIS — C61 Malignant neoplasm of prostate: Secondary | ICD-10-CM | POA: Diagnosis not present

## 2019-08-08 ENCOUNTER — Other Ambulatory Visit: Payer: Self-pay

## 2019-08-08 ENCOUNTER — Encounter: Payer: Self-pay | Admitting: *Deleted

## 2019-08-08 ENCOUNTER — Ambulatory Visit
Admission: RE | Admit: 2019-08-08 | Discharge: 2019-08-08 | Disposition: A | Discharge status: Home / Self Care | Payer: No Typology Code available for payment source | Source: Ambulatory Visit | Attending: Radiation Oncology | Admitting: Radiation Oncology

## 2019-08-08 DIAGNOSIS — C61 Malignant neoplasm of prostate: Secondary | ICD-10-CM | POA: Diagnosis not present

## 2019-08-30 ENCOUNTER — Other Ambulatory Visit: Payer: Self-pay | Admitting: *Deleted

## 2019-08-30 DIAGNOSIS — C61 Malignant neoplasm of prostate: Secondary | ICD-10-CM

## 2019-09-07 ENCOUNTER — Telehealth: Payer: Self-pay

## 2019-09-07 DIAGNOSIS — C61 Malignant neoplasm of prostate: Secondary | ICD-10-CM

## 2019-09-07 NOTE — Progress Notes (Signed)
Pt "showed up" here at the [facility] for his SCP visit.   States would rather come here than over the phone.   Survivorship Care Plan visit completed.  Treatment summary reviewed and given to patient.  ASCO answers booklet reviewed and given to patient.  CARE program and Cancer Transitions discussed with patient along with other resources cancer center offers to patients and caregivers.  Patient verbalized understanding.  IPt in agreement for  APP will call him next Friday 09/14/2019 at 2:00 on the telephone to introduce him to [facility].  Informed him this is telephone visit and he does not need to come up here.  Pt verbalized understanding.

## 2019-09-07 NOTE — Telephone Encounter (Signed)
Telephone call to patient to discuss SCP visit.   No answer but left message for pt to return my call.   Waiting for call.

## 2019-09-10 ENCOUNTER — Ambulatory Visit: Payer: Medicare Other | Admitting: Radiation Oncology

## 2019-09-14 ENCOUNTER — Inpatient Hospital Stay: Payer: Medicare Other | Attending: Nurse Practitioner | Admitting: Nurse Practitioner

## 2019-09-14 ENCOUNTER — Encounter: Payer: Self-pay | Admitting: Nurse Practitioner

## 2019-09-14 DIAGNOSIS — C61 Malignant neoplasm of prostate: Secondary | ICD-10-CM | POA: Diagnosis not present

## 2019-09-14 NOTE — Progress Notes (Signed)
Survivorship Clinic Consult Note [facility]  Telephone:([phone] [phone]  Virtual Visit Progress Note  I connected with Antonio Vance on 09/14/19 at  2:00 PM EST by telephone visit and verified that I am speaking with the correct person using two identifiers.   I discussed the limitations, risks, security and privacy concerns of performing an evaluation and management service by telemedicine and the availability of in-person appointments. I also discussed with the patient that there may be a patient responsible charge related to this service. The patient expressed understanding and agreed to proceed.   Other persons participating in the visit and their role in the encounter: none  Patient's location: home Provider's location: home  Chief Complaint: Prostate cancer  CLINIC:  Survivorship  REASON FOR VISIT: Continued surveillance visit for patient diagnosed with stage IIA prostate cancer s/p IMRT radiation therapy, who presents to Survivorship Clinic for review of [place] and to discuss late and long term effects of treatment.   BRIEF ONCOLOGIC HISTORY:  Oncology History Overview Note  Patient initially presented a 74 year old male noted to have rising PSA as well as prostatic asymmetry.  PSA was 8.39 on 02/2019.  He underwent a prostate MRI for further evaluation that showed a signal abnormality within the right mid peripheral zone, PI-RADS 4: Highly concerning for prostate cancer, no evidence of extracapsular extension, vesicle involvement, or lymph node involvement.  He underwent a fusion biopsy at [facility] urology on 04/10/2019.  Fusion biopsy revealed four cores bilaterally Gleason 3+4 disease up to 20%.  In addition to this, the region of interest also indicated Gleason 3+4 disease up to 20%.  Volume on prostate biopsy by MRI was 65 cc.  Tolerated biopsy well without significant side effects.  Given his age and comorbidities, treatment  options were discussed in detail.  He met with Dr. Baruch Gouty and opted to undergo IMRT radiation therapy and completed treatment from 06/12/2019-08/08/2019.   Prostate cancer ([place])  04/23/2019 Initial Diagnosis   Prostate cancer [facility])     INTERVAL HISTORY:  Patient presents to the survivorship clinic today for initial meeting to review his survivorship care plan detailing his treatment course for prostate cancer, as well as monitoring long-term side effects of that treatment, education regarding health maintenance, screening, and overall wellness and health promotion.  Overall, he reports feeling well since completing treatment.  He offers no specific complaints today.  REVIEW OF SYSTEMS:  Review of Systems  Constitutional: Negative.   HENT: Negative.   Eyes: Negative.   Respiratory: Negative.   Cardiovascular: Negative.   Gastrointestinal: Negative.   Genitourinary: Negative for dysuria, flank pain, frequency, hematuria and urgency.  Musculoskeletal: Negative.   Skin: Negative.   Neurological: Negative.   Endo/Heme/Allergies: Negative.   Psychiatric/Behavioral: Negative.    Denies difficulty, painful urination, blood in urine.  ONCOLOGY TREATMENT TEAM:  1. Urologist: Dr. Erlene Quan 2. Radiation Oncologist: Dr. Baruch Gouty   PAST MEDICAL/SURGICAL HISTORY:  Past Medical History:  Diagnosis Date  . Bradycardia   . Chronic kidney disease   . Hypertension    Past Surgical History:  Procedure Laterality Date  . COLONOSCOPY    . COLONOSCOPY N/A 12/28/2016   Procedure: COLONOSCOPY;  Surgeon: Lollie Sails, MD;  Location: [facility] ENDOSCOPY;  Service: Endoscopy;  Laterality: N/A;  . ESOPHAGOGASTRODUODENOSCOPY (EGD) WITH PROPOFOL N/A 12/28/2016   Procedure: ESOPHAGOGASTRODUODENOSCOPY (EGD) WITH PROPOFOL;  Surgeon: Lollie Sails, MD;  Location: [facility] ENDOSCOPY;  Service: Endoscopy;  Laterality: N/A;  . HERNIA REPAIR  SOCIAL HISTORY:  '@SOCIALHX' @  ALLERGIES:  No Known  Allergies  CURRENT MEDICATIONS:  Outpatient Encounter Medications as of 09/14/2019  Medication Sig  . amLODipine (NORVASC) 10 MG tablet Take 10 mg by mouth daily.   Marland Kitchen ascorbic acid (VITAMIN C) 500 MG tablet Take 500 mg by mouth daily.  Marland Kitchen aspirin 81 MG tablet Take 81 mg by mouth daily.   Marland Kitchen b complex vitamins capsule Take 1 capsule by mouth daily.   Marland Kitchen lisinopril (PRINIVIL,ZESTRIL) 10 MG tablet Take 10 mg by mouth daily.  . Multiple Vitamin (MULTIVITAMIN) tablet Take 1 tablet by mouth daily.  . pantoprazole (PROTONIX) 40 MG tablet Take 1 tablet (40 mg total) by mouth daily.   No facility-administered encounter medications on file as of 09/14/2019.    ONCOLOGIC FAMILY HISTORY:  Family History  Problem Relation Age of Onset  . Hypertension Mother   . Cancer Mother   . Hypertension Father   . Diabetes Brother   . Hypertension Brother     GENETIC COUNSELING/TESTING: Not indicated  PHYSICAL EXAMINATION:  Exam limited due to telemedicine  LABORATORY DATA:  None at this visit.  DIAGNOSTIC IMAGING:  None at this visit.   ASSESSMENT & PLAN:  Antonio Vance is a pleasant 74 y.o. male with history of Stage IIa T1c N0 M0 adenocarcinoma of the prostate, Gleason score of 7 (3+4) who presented with a PSA of 8.4 in August 2020, treated with IMRT radiation therapy to his prostate, 8000 cGy over 8 weeks and completed treatment on 08/08/2019. He presents to the Survivorship clinic for our initial meeting and routine follow-up since completing treatment. We reviewed his survivorship care plan and addressed any acute survivorship concerns since completing treatment.   1. History of stage IIA prostate cancer: Antonio Vance is continuing to recover from definitive treatment for prostate cancer.  Today, he received a copy of his comprehensive survivorship care plan (SCP) which was reviewed with in detail.  The SCP details cancer diagnosis, treatment course, potential late/long-term effects of treatments  appropriate follow-up care with recommendations for future, and patient education resources.  A copy of this summary, along with a letter will be sent to the patient's primary care provider via mail/in basket after today's visit. We discussed that once you have had prostate cancer we encourage patients to continue to follow-up with your health care team to continue to monitor help and manage any effects of treatment.  We discussed that there is a chance for the cancer can come back.  The vast majority of recurrences will be detected in the 2-3 years after treatment.    The recommended surveillance for follow-up after diagnosis was reviewed in detail but depending on risk, for patients who received a) initial definitive therapy this includes a PSA every 6-12 months for 5 years then annually thereafter, digital rectal exam every year but may be omitted if PSA is undetectable.  PSA as frequently as every 3 months may be necessary to clarify disease status particularly in high risk men.   We discussed that the goal of surveillance after treatment for prostate cancer is to detect disease if it returns as early as possible.  The most useful tools for detecting recurrence include a thorough evaluation of symptoms and a physical exam and monitoring of PSA levels. Imaging may be considered based on symptoms or examination findings.  Blood work may be performed at these visits for if there are symptoms or exam findings concerning for recurrence.  Patient was advised that if  he feels that something is not right he should schedule an appointment to be evaluated.  Symptoms discussed in detail including changes in urination or painful urination, blood in the urine, blood in the semen, bone pain, losing weight without trying, erectile dysfunction.  Antonio Vance will return to the survivorship clinic as needed; He will return to the [place] at [facility] for surveillance visit with medical-oncology, urology, and  radiation-oncology. He was made aware to notify a member of his care team if he develops signs or symptoms of recurrence or complications of treatments.   2. Survivorship Care Survey: Survivorship Care Survey completed post-treatment as recommended by the NCCN Survivorship Guidelines. Patient's answers were reviewed. Based on these, as well as the type of cancer and treatment he underwent, the following Survivorship Topics were discussed:   - Anxiety, Depression, and Distress-patient denies - Cognitive Function-patient denies - Fatigue-patient denies and says that he has recovered well - Hormone-Related Symptoms-patient denies - Pain-patient denies - Sexual Function-we discussed the treatment for prostate cancer can often result in changes to one sexual function and sexuality.  We discussed that erectile dysfunction can be a common side effect of cancer treatments and often there are medications and treatments available to help with this.  If interested, I encouraged patient to review information on the American cancer website : https://www.cancer.org/treatment/treatments-and-side-effects/physical-side-effects/fertility-and-sexual-side-effects/sexuality-for-men-with-cancer.html - Sleep Disorder-patient denies problems sleeping - Healthy Lifestyle- see below - Immunizations and Infections- see below  3. Smoking cessation/Tobacco Avoidance: I commended Antonio Vance continued efforts to remain tobacco-free.  Says he quit approximately 30 years ago we discussed that one of the most important risk reduction strategies in preventing cancer recurrence is smoking cessation.  Patient is committed to abstaining from tobacco.  4. Cancer screening/Health & Wellness Promotion:  Due to Antonio Vance history and age, he should receive screening for skin cancers and colon cancer. The information and recommendations are listed on the patient's comprehensive care plan/treatment summary and were reviewed in detail with  the patient.  I encouraged him to speak with his PCP about arranging these and performing annual wellness exams, as appropriate.    Colorectal cancer-for the average risk patient, screening for colon cancer is recommended beginning at age 79. For people ages 41 through 27, the decision to be screened should be based on a person's preferences, life expectancy, overall health, and prior screening history. Various screening strategies exist including colonoscopy, sigmoidoscopy, stool testing for blood, etc.  You can discuss these with your primary care provider to determine which option is best for you.   I encouraged him to discuss routine vaccinations (Flu, Pneumonia, and Shingles) with PCP and ensure that he is up to date on them. Education regarding covid 19 vaccine discussed.   5. Physical activity/Healthy eating: Getting adequate physical activity and maintaining a healthy diet as a cancer survivor is important for overall wellness and reduces the risk of cancer recurrence.  We reviewed the "Nutrition Rainbow" handout, as well as the handout "Take Control of Your Health and Reduce Your Cancer Risk" from the [employer].  Patient was also encouraged to engage in moderate to vigorous exercise for 30 minutes per day most days of the week. We discussed the CARE program which is offered 2 days a week at [facility] without cost to cancer survivors. At this time he declines referral to the CARE program but should he change his mind, a referral can be placed in Epic by entering the order 'Referral to CARE' at [facility].  We also discussed the importance and health benefits of maintaining a healthy weight and eating a balanced diet. Antonio Vance was encouraged to consume 5-7 servings of fruits and vegetables per day. We discussed that a healthy BMI is 18.5-24.9 and that maintaining a health weight reduces risk of cancer recurrences.   6. Support services/Counseling: Antonio Vance was seen today in in effort to  address both the physical and social concerns of our cancer survivors at [facility]. It is not uncommon for this period of the patient's cancer care trajectory to be one of many emotions and stressors.  I provided support today through active listening, validation of concerns, and expressive supportive counseling.  Antonio Vance was encouraged to take advantage of our support services programs and support groups to better cope in his new life as a cancer survivor after completing anti-cancer treatment. We specifically discussed the Cancer Transitions program and I encouraged him to participate. We also discussed Counseling Services available to Cancer Survivors which is available through self referral.    Dispo:  - Return to [facility] for follow-up with Urology, Dr. Erlene Quan as scheduled on 11/23/19 - Return to [facility] for follow-up with Radiation Oncology, Dr. Baruch Gouty, on 09/21/19 as scheduled - Return to survivorship clinic as needed; no additional follow-up needed at this time.   I discussed the assessment and treatment plan with the patient. The patient was provided an opportunity to ask questions and all were answered. The patient agreed with the plan and demonstrated an understanding of the instructions.   The patient was advised to call back or seek an in-person evaluation if the symptoms worsen or if the condition fails to improve as anticipated.   I provided 10 minutes of non face-to-face telephone visit time during this encounter, and > 50% was spent counseling as documented under my assessment & plan.  Beckey Rutter, DNP, AGNP-C [place] at [facility]   Note: PRIMARY CARE PROVIDER [name], [place] [phone]

## 2019-09-21 ENCOUNTER — Other Ambulatory Visit: Payer: Self-pay

## 2019-09-21 ENCOUNTER — Encounter: Payer: Self-pay | Admitting: Radiation Oncology

## 2019-09-21 ENCOUNTER — Ambulatory Visit
Admission: RE | Admit: 2019-09-21 | Discharge: 2019-09-21 | Disposition: A | Discharge status: Home / Self Care | Payer: 59 | Source: Ambulatory Visit | Attending: Radiation Oncology | Admitting: Radiation Oncology

## 2019-09-21 VITALS — BP 155/60 | HR 57 | Temp 97.0°F | Resp 16 | Wt 209.0 lb

## 2019-09-21 DIAGNOSIS — R5383 Other fatigue: Secondary | ICD-10-CM | POA: Insufficient documentation

## 2019-09-21 DIAGNOSIS — R197 Diarrhea, unspecified: Secondary | ICD-10-CM | POA: Diagnosis not present

## 2019-09-21 DIAGNOSIS — Z923 Personal history of irradiation: Secondary | ICD-10-CM | POA: Diagnosis not present

## 2019-09-21 DIAGNOSIS — C61 Malignant neoplasm of prostate: Secondary | ICD-10-CM | POA: Insufficient documentation

## 2019-09-21 NOTE — Progress Notes (Signed)
Radiation Oncology Follow up Note  Name: Antonio Vance   Date:   09/21/2019 MRN:  [id number] DOB: [birth year]    This 74 y.o. male presents to the clinic today for 1 month follow-up status post external beam radiation therapy for stage IIa (T1 cN0 M0) Gleason 7 (3+4) adenocarcinoma prostate presenting with a PSA of 8.4.  REFERRING PROVIDER: [name], MD  HPI: Patient is a 74 year old male now at 1 month having completed image guided IMRT radiation therapy to his prostate for stage IIa adenocarcinoma.  Seen today in routine follow-up he is doing well he had very low so side effect profile during treatments.  He continues to have very low side effect specifically denies any increased lower urinary tract symptoms diarrhea fatigue..  COMPLICATIONS OF TREATMENT: none  FOLLOW UP COMPLIANCE: keeps appointments   PHYSICAL EXAM:  BP (!) 155/60   Pulse (!) 57   Temp (!) 97 F (36.1 C)   Resp 16   Wt 209 lb (94.8 kg)   BMI 30.86 kg/m  Well-developed well-nourished patient in NAD. HEENT reveals PERLA, EOMI, discs not visualized.  Oral cavity is clear. No oral mucosal lesions are identified. Neck is clear without evidence of cervical or supraclavicular adenopathy. Lungs are clear to A&P. Cardiac examination is essentially unremarkable with regular rate and rhythm without murmur rub or thrill. Abdomen is benign with no organomegaly or masses noted. Motor sensory and DTR levels are equal and symmetric in the upper and lower extremities. Cranial nerves II through XII are grossly intact. Proprioception is intact. No peripheral adenopathy or edema is identified. No motor or sensory levels are noted. Crude visual fields are within normal range.  RADIOLOGY RESULTS: No current films to review  PLAN: Present time patient is doing well very low side effect profile from his recent image guided radiation therapy.  I explained to him and his daughter today we will check his PSA in about 3 months in order to  follow-up at that time with blood drawn.  Patient and daughter both comprehend my recommendations well.  I would like to take this opportunity to thank you for allowing me to participate in the care of your patient.[name], MD

## 2019-10-26 ENCOUNTER — Ambulatory Visit: Payer: Medicare Other | Admitting: Urology

## 2019-11-23 ENCOUNTER — Ambulatory Visit: Payer: Medicare Other | Admitting: Urology

## 2019-12-21 ENCOUNTER — Inpatient Hospital Stay: Payer: No Typology Code available for payment source | Attending: Radiation Oncology

## 2019-12-21 ENCOUNTER — Other Ambulatory Visit: Payer: Self-pay

## 2019-12-21 DIAGNOSIS — C61 Malignant neoplasm of prostate: Secondary | ICD-10-CM | POA: Diagnosis present

## 2019-12-21 LAB — PSA: Prostatic Specific Antigen: 2.21 ng/mL (ref 0.00–4.00)

## 2019-12-28 ENCOUNTER — Ambulatory Visit
Admission: RE | Admit: 2019-12-28 | Discharge: 2019-12-28 | Disposition: A | Discharge status: Home / Self Care | Payer: No Typology Code available for payment source | Source: Ambulatory Visit | Attending: Radiation Oncology | Admitting: Radiation Oncology

## 2019-12-28 ENCOUNTER — Ambulatory Visit (INDEPENDENT_AMBULATORY_CARE_PROVIDER_SITE_OTHER): Payer: No Typology Code available for payment source | Admitting: Urology

## 2019-12-28 ENCOUNTER — Encounter: Payer: Self-pay | Admitting: Urology

## 2019-12-28 ENCOUNTER — Other Ambulatory Visit: Payer: Self-pay

## 2019-12-28 VITALS — BP 150/88 | HR 53 | Temp 97.0°F | Resp 16

## 2019-12-28 VITALS — BP 115/65 | HR 61 | Ht 69.0 in | Wt 208.0 lb

## 2019-12-28 DIAGNOSIS — C61 Malignant neoplasm of prostate: Secondary | ICD-10-CM

## 2019-12-28 DIAGNOSIS — Z923 Personal history of irradiation: Secondary | ICD-10-CM | POA: Diagnosis not present

## 2019-12-28 LAB — BLADDER SCAN AMB NON-IMAGING: Scan Result: 2

## 2019-12-28 NOTE — Progress Notes (Signed)
12/28/2019 1:41 PM   Antonio Vance 1945-10-05 [id number]  Referring provider: [name], [place] [address] [place] [place],  [place] [address]  Chief Complaint  Patient presents with  . Follow-up    Urologic history: 1.  T1c prostate cancer (NCCN intermediate risk favorable) -Slowly rising PSA, 8.39 02/2019 -Prostate MRI PI-RADS 4 lesion right mid PZ -Fusion biopsy 04/2019 ROI Gleason 3+4 adenocarcinoma in addition to 4 template biopsies positive Gleason 3+4 -Prostate volume 65 cc -Treated IMRT completed 08/08/2019   HPI: 74 y.o. male presents for prostate cancer follow-up  -Tolerated treatment well -No bothersome LUTS, dysuria or gross hematuria -IPSS today 3/35; bother 2/6 -PSA 12/21/2019 2.21 -Seen in radiation oncology this morning  PMH: Past Medical History:  Diagnosis Date  . Bradycardia   . Chronic kidney disease   . Hypertension     Surgical History: Past Surgical History:  Procedure Laterality Date  . COLONOSCOPY    . COLONOSCOPY N/A 12/28/2016   Procedure: COLONOSCOPY;  Surgeon: Lollie Sails, MD;  Location: [facility] ENDOSCOPY;  Service: Endoscopy;  Laterality: N/A;  . ESOPHAGOGASTRODUODENOSCOPY (EGD) WITH PROPOFOL N/A 12/28/2016   Procedure: ESOPHAGOGASTRODUODENOSCOPY (EGD) WITH PROPOFOL;  Surgeon: Lollie Sails, MD;  Location: [facility] ENDOSCOPY;  Service: Endoscopy;  Laterality: N/A;  . HERNIA REPAIR      Home Medications:  Allergies as of 12/28/2019   No Known Allergies     Medication List       Accurate as of December 28, 2019  1:41 PM. If you have any questions, ask your nurse or doctor.        amLODipine 10 MG tablet Commonly known as: NORVASC Take 10 mg by mouth daily.   ascorbic acid 500 MG tablet Commonly known as: VITAMIN C Take 500 mg by mouth daily.   aspirin 81 MG tablet Take 81 mg by mouth daily.   b complex vitamins capsule Take 1 capsule by mouth daily.   lisinopril 10 MG tablet Commonly known as: ZESTRIL Take 10 mg by  mouth daily.   multivitamin tablet Take 1 tablet by mouth daily.   pantoprazole 40 MG tablet Commonly known as: PROTONIX Take 1 tablet (40 mg total) by mouth daily.       Allergies: No Known Allergies  Family History: Family History  Problem Relation Age of Onset  . Hypertension Mother   . Cancer Mother   . Hypertension Father   . Diabetes Brother   . Hypertension Brother     Social History:  reports that he has quit smoking. He has never used smokeless tobacco. He reports that he does not drink alcohol and does not use drugs.   Physical Exam: BP 115/65   Pulse 61   Ht 5\' 9"  (1.753 m)   Wt 208 lb (94.3 kg)   BMI 30.72 kg/m   Constitutional:  Alert and oriented, No acute distress. HEENT: [place] AT, moist mucus membranes.  Trachea midline, no masses. Cardiovascular: No clubbing, cyanosis, or edema. Respiratory: Normal respiratory effort, no increased work of breathing. Skin: No rashes, bruises or suspicious lesions. Neurologic: Grossly intact, no focal deficits, moving all 4 extremities. Psychiatric: Normal mood and affect.    Assessment & Plan:    1. Prostate cancer  Doing well status post IMRT for intermediate risk adenocarcinoma prostate.  PVR by bladder scan today 2 mL.  Radiation oncology follow-up scheduled in 6 months and will see him back in 12 months with a PSA   Abbie Sons, MD  [place] [address] [place], [place] [address] [phone]

## 2019-12-28 NOTE — Progress Notes (Signed)
Radiation Oncology Follow up Note  Name: Antonio Vance   Date:   12/28/2019 MRN:  [id number] DOB: [birth year]    This 74 y.o. male presents to the clinic today for 65-month follow-up status post external beam radiation therapy for stage IIa (T1 cN0 M0) Gleason 7 (3+4) adenocarcinoma presenting with a PSA of 8.4..  REFERRING PROVIDER: [name], MD  HPI: Patient is a 74 year old male seen in routine follow-up 4 months out from completing image guided IMRT radiation therapy to his prostate for Gleason 7 (3+4) adenocarcinoma presenting with a PSA of 8.4 seen today in routine follow-up he is doing well he specifically denies any increased lower urinary tract symptoms diarrhea or fatigue.  His most recent PSA is 2.2..  COMPLICATIONS OF TREATMENT: none  FOLLOW UP COMPLIANCE: keeps appointments   PHYSICAL EXAM:  BP (!) 150/88 (BP Location: Left Arm, Patient Position: Sitting, Cuff Size: Large)   Pulse (!) 53   Temp (!) 97 F (36.1 C) (Tympanic)   Resp 16  Well-developed well-nourished patient in NAD. HEENT reveals PERLA, EOMI, discs not visualized.  Oral cavity is clear. No oral mucosal lesions are identified. Neck is clear without evidence of cervical or supraclavicular adenopathy. Lungs are clear to A&P. Cardiac examination is essentially unremarkable with regular rate and rhythm without murmur rub or thrill. Abdomen is benign with no organomegaly or masses noted. Motor sensory and DTR levels are equal and symmetric in the upper and lower extremities. Cranial nerves II through XII are grossly intact. Proprioception is intact. No peripheral adenopathy or edema is identified. No motor or sensory levels are noted. Crude visual fields are within normal range.  RADIOLOGY RESULTS: No current films for review.  PLAN: At the present time patient is doing well he has had a significant drop in his PSA although would like to see it below 1.  He is asymptomatic at this time.,  I have asked to see him  back in 6 months with a repeat PSA at that time.  Patient knows to call with any concerns.  I would like to take this opportunity to thank you for allowing me to participate in the care of your patient.[name], MD

## 2020-06-13 ENCOUNTER — Other Ambulatory Visit: Payer: Self-pay

## 2020-06-13 ENCOUNTER — Inpatient Hospital Stay: Payer: No Typology Code available for payment source | Attending: Radiation Oncology

## 2020-06-13 DIAGNOSIS — C61 Malignant neoplasm of prostate: Secondary | ICD-10-CM

## 2020-06-13 LAB — PSA: Prostatic Specific Antigen: 3.29 ng/mL (ref 0.00–4.00)

## 2020-06-20 ENCOUNTER — Other Ambulatory Visit: Payer: Self-pay

## 2020-06-20 ENCOUNTER — Other Ambulatory Visit: Payer: Self-pay | Admitting: *Deleted

## 2020-06-20 ENCOUNTER — Ambulatory Visit
Admission: RE | Admit: 2020-06-20 | Discharge: 2020-06-20 | Disposition: A | Discharge status: Home / Self Care | Payer: Medicare Other | Source: Ambulatory Visit | Attending: Radiation Oncology | Admitting: Radiation Oncology

## 2020-06-20 ENCOUNTER — Encounter: Payer: Self-pay | Admitting: Radiation Oncology

## 2020-06-20 VITALS — BP 142/64 | HR 40 | Temp 98.2°F | Wt 207.9 lb

## 2020-06-20 DIAGNOSIS — C61 Malignant neoplasm of prostate: Secondary | ICD-10-CM

## 2020-06-20 NOTE — Progress Notes (Signed)
Radiation Oncology Follow up Note  Name: Antonio Vance   Date:   06/20/2020 MRN:  [id number] DOB: 02/23/1946    This 74 y.o. male presents to the clinic today for 69-month follow-up status post external beam radiation therapy for stage IIa Gleason 7 (3+4) adenocarcinoma presenting with a PSA of 8.4.  REFERRING PROVIDER: [name], MD  HPI: Patient is a 74 year old male now out 10 months having completed IMRT radiation therapy for stage IIa Gleason 7 adenocarcinoma seen today in routine follow-up he is doing well.  He specifically denies any increased lower urinary tract symptoms.  He does have nocturia x3.  His PSA has gone up incrementally from 2.26 months ago to 3.27 days ago.  COMPLICATIONS OF TREATMENT: none  FOLLOW UP COMPLIANCE: keeps appointments   PHYSICAL EXAM:  BP (!) 142/64   Pulse (!) 40   Temp 98.2 F (36.8 C) (Tympanic)   Wt 207 lb 14.3 oz (94.3 kg)   BMI 30.70 kg/m  Well-developed well-nourished patient in NAD. HEENT reveals PERLA, EOMI, discs not visualized.  Oral cavity is clear. No oral mucosal lesions are identified. Neck is clear without evidence of cervical or supraclavicular adenopathy. Lungs are clear to A&P. Cardiac examination is essentially unremarkable with regular rate and rhythm without murmur rub or thrill. Abdomen is benign with no organomegaly or masses noted. Motor sensory and DTR levels are equal and symmetric in the upper and lower extremities. Cranial nerves II through XII are grossly intact. Proprioception is intact. No peripheral adenopathy or edema is identified. No motor or sensory levels are noted. Crude visual fields are within normal range.  RADIOLOGY RESULTS: No current films for review  PLAN: Present time I am referring him back to Dr. Bernardo Heater to start Eligard 24-month injection.  We will reevaluate his PSA 6 months out.  Should he continue to have PSA rise will refer to medical oncology for opinion.  Patient and his daughter both seem to  comprehend my recommendations well.  Patient is to call with any concerns.  I would like to take this opportunity to thank you for allowing me to participate in the care of your patient.[name], MD

## 2020-06-23 ENCOUNTER — Ambulatory Visit: Payer: Medicare Other | Admitting: Radiation Oncology

## 2020-06-23 ENCOUNTER — Telehealth: Payer: Self-pay

## 2020-06-23 NOTE — Telephone Encounter (Signed)
Benefits investigation faxed for Lupron/Eligard.

## 2020-07-03 NOTE — Telephone Encounter (Signed)
Called pt's insurance for update they state that they need more clinical information which cannot be given over the phone for this pt's plan. They will fax form today.

## 2020-07-09 ENCOUNTER — Telehealth: Payer: Self-pay

## 2020-07-09 NOTE — Telephone Encounter (Signed)
Incoming fax from [place], with approval for Eligard. Dates approved: 07/04/20 - 07/04/21. Case #[id number]. Pt called.

## 2020-07-09 NOTE — Telephone Encounter (Signed)
Called pt's daughter per DPR to schedule Eligard. No answer. Left detailed message asking daughter to call back to schedule pt's Eligard injection.

## 2020-07-09 NOTE — Telephone Encounter (Signed)
-----   Message from Hulen Luster, RN sent at 06/20/2020 11:32 AM EST ----- Regarding: Eligard Per Dr. Rushie Chestnut, Mr. Girouard needs Eligard with Dr. Lonna Cobb.   Fridays work best for the patient and they would like to wait until January.   Please call the either one of the daughters Yehuda Savannah [phone] or Berline Lopes [phone].   They are a bit on the anxious side, they wanted to come to the office to get the appointment, I said it would be better to just let me send the message and then you will call them.  Thanks,   [employer]

## 2020-07-11 NOTE — Telephone Encounter (Signed)
Daughter called back. Appointment scheduled.

## 2020-07-25 ENCOUNTER — Ambulatory Visit (INDEPENDENT_AMBULATORY_CARE_PROVIDER_SITE_OTHER): Payer: Medicare Other

## 2020-07-25 ENCOUNTER — Other Ambulatory Visit: Payer: Self-pay

## 2020-07-25 DIAGNOSIS — C61 Malignant neoplasm of prostate: Secondary | ICD-10-CM | POA: Diagnosis not present

## 2020-07-25 MED ORDER — LEUPROLIDE ACETATE (6 MONTH) 45 MG ~~LOC~~ KIT
45.0000 mg | PACK | Freq: Once | SUBCUTANEOUS | Status: AC
  Administered 2020-07-25: 45 mg via SUBCUTANEOUS

## 2020-07-25 NOTE — Progress Notes (Signed)
Eligard SubQ Injection   Due to Prostate Cancer patient is present today for a Eligard Injection.  Medication: Eligard 6 month Dose: 45 mg  Location: right  Lot: [id number]  Exp: 09/2021  Patient tolerated well, no complications were noted.  Performed by: Gordy Clement, [place]   Per Dr. Baruch Gouty patient will have PSA checked in 6 months to determine follow up. Patient's next follow up was scheduled for July 2021, pending oncology recommendations. This appointment was scheduled using wheel and given to patient today along with reminder continue on Vitamin D 800-1000iu and Calium 1000-1200mg  daily while on Androgen Deprivation Therapy.  PA approval dates:  07/04/20 - 07/04/21

## 2020-07-25 NOTE — Patient Instructions (Signed)

## 2020-12-12 ENCOUNTER — Other Ambulatory Visit: Payer: Self-pay | Admitting: *Deleted

## 2020-12-19 ENCOUNTER — Other Ambulatory Visit: Payer: Self-pay

## 2020-12-19 ENCOUNTER — Inpatient Hospital Stay: Payer: Medicare Other | Attending: Radiation Oncology

## 2020-12-19 DIAGNOSIS — C61 Malignant neoplasm of prostate: Secondary | ICD-10-CM | POA: Diagnosis present

## 2020-12-19 LAB — PSA: Prostatic Specific Antigen: 0.55 ng/mL (ref 0.00–4.00)

## 2020-12-26 ENCOUNTER — Other Ambulatory Visit: Payer: Self-pay

## 2020-12-26 ENCOUNTER — Other Ambulatory Visit: Payer: Self-pay | Admitting: *Deleted

## 2020-12-26 ENCOUNTER — Ambulatory Visit
Admission: RE | Admit: 2020-12-26 | Discharge: 2020-12-26 | Disposition: A | Discharge status: Home / Self Care | Payer: Medicare Other | Source: Ambulatory Visit | Attending: Radiation Oncology | Admitting: Radiation Oncology

## 2020-12-26 VITALS — BP 161/53 | HR 45 | Temp 96.5°F | Resp 16 | Wt 211.6 lb

## 2020-12-26 DIAGNOSIS — Z923 Personal history of irradiation: Secondary | ICD-10-CM | POA: Diagnosis not present

## 2020-12-26 DIAGNOSIS — C61 Malignant neoplasm of prostate: Secondary | ICD-10-CM | POA: Diagnosis not present

## 2020-12-26 NOTE — Progress Notes (Signed)
Radiation Oncology Follow up Note  Name: Antonio Vance   Date:   12/26/2020 MRN:  [id number] DOB: [birth year]    This 75 y.o. male presents to the clinic today for 51-month follow-up status post external beam treatment IMRT for stage IIa Gleason 7 (3+4) adenocarcinoma prostate presenting with a PSA of 8.4.  REFERRING PROVIDER: [name], MD  HPI: Patient is a 75 year old male now out 16 months having completed IMRT radiation therapy for Gleason 7 adenocarcinoma the prostate.  Seen today in routine follow-up from a symptom standpoint he is doing extremely well specifically denies any lower urinary tract symptoms increase diarrhea fatigue.Marland Kitchen  He did receive back in December and a 44-month Eligard for slight rise in his PSA.  His PSA had gone up to 3.296 months ago now down to 0.557 days ago.  COMPLICATIONS OF TREATMENT: none  FOLLOW UP COMPLIANCE: keeps appointments   PHYSICAL EXAM:  BP (!) 161/53   Pulse (!) 45   Temp (!) 96.5 F (35.8 C)   Resp 16   Wt 211 lb 9.6 oz (96 kg)   SpO2 100%   BMI 31.25 kg/m  Well-developed well-nourished patient in NAD. HEENT reveals PERLA, EOMI, discs not visualized.  Oral cavity is clear. No oral mucosal lesions are identified. Neck is clear without evidence of cervical or supraclavicular adenopathy. Lungs are clear to A&P. Cardiac examination is essentially unremarkable with regular rate and rhythm without murmur rub or thrill. Abdomen is benign with no organomegaly or masses noted. Motor sensory and DTR levels are equal and symmetric in the upper and lower extremities. Cranial nerves II through XII are grossly intact. Proprioception is intact. No peripheral adenopathy or edema is identified. No motor or sensory levels are noted. Crude visual fields are within normal range.  RADIOLOGY RESULTS: No current films to review  PLAN: Present time patient's showed excellent response to Eligard.  I will wait another 6 months and see him back with a repeat PSA  at that time.  I believe for a while at his age we can pulse him with Eligard.  Should he see substantial rise in his PSA will refer him to medical oncology for evaluation.  Patient and daughter both comprehend my treatment plan well.  I would like to take this opportunity to thank you for allowing me to participate in the care of your patient.[name], MD

## 2020-12-31 ENCOUNTER — Ambulatory Visit: Payer: Self-pay | Admitting: Urology

## 2021-01-02 ENCOUNTER — Ambulatory Visit: Payer: Self-pay | Admitting: Urology

## 2021-01-14 IMAGING — MR MR PROSTATE WO/W CM
37 series · 48 of 48 positions shown · IV contrast (gadavist)
Comparison: 04/07/2018 abdominopelvic CT.
COMPARISON: 04/07/2018 abdominopelvic CT.

Addendum:
CLINICAL DATA: Elevated PSA of 8.4.  No prior surgery or biopsy.

EXAM:
MR PROSTATE WITHOUT AND WITH CONTRAST
TECHNIQUE: Multiplanar multisequence MRI images were obtained of the pelvis
centered about the prostate. Pre and post contrast images were
obtained.
CONTRAST:  10mL GADAVIST GADOBUTROL 1 MMOL/ML IV SOLN

[Series 3: T1 · axial · 8.0mm · 0.74mm/px · z∈[-64,+176]mm · 2 of 25 slices shown (1 of 27)]
[im 1/25]
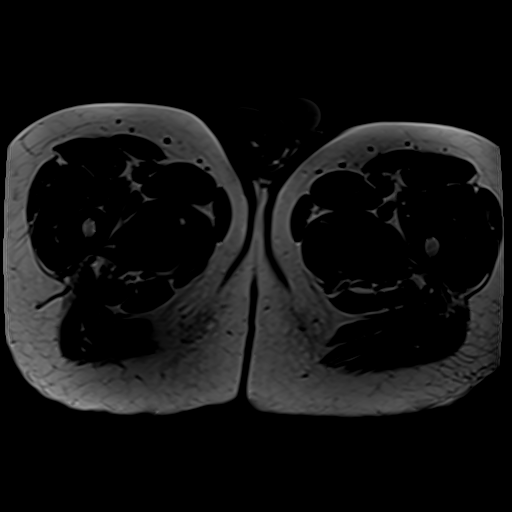
[im 25/25]
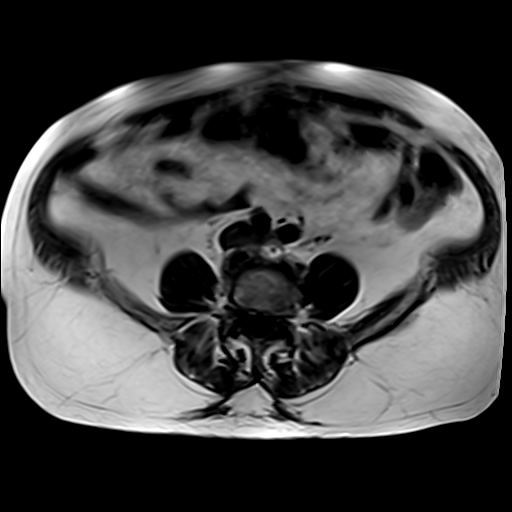

[Series 4: bSSFP fat-sat · axial · 8.0mm · 0.74mm/px · z∈[-64,+176]mm · 2 of 25 slices shown]
[im 1/25]
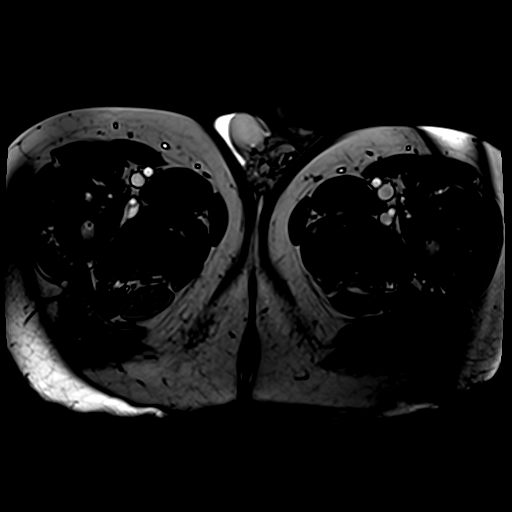
[im 25/25]
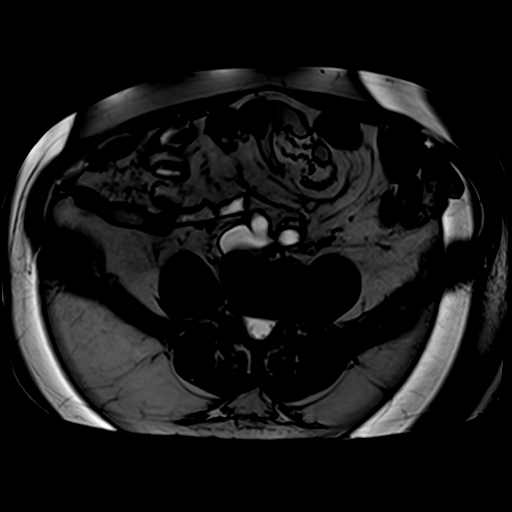

[Series 5: T2 · coronal · 3.0mm · 0.70mm/px · 2 of 30 slices shown (1 of 3)]
[im 1/30]
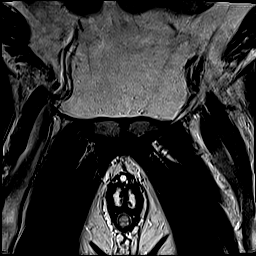
[im 30/30]
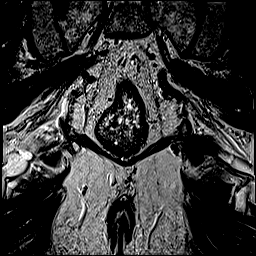

[Series 7: T1 · axial · 3.0mm · 0.35mm/px · z∈[-11,+91]mm · 2 of 30 slices shown (2 of 27)]
[im 1/30]
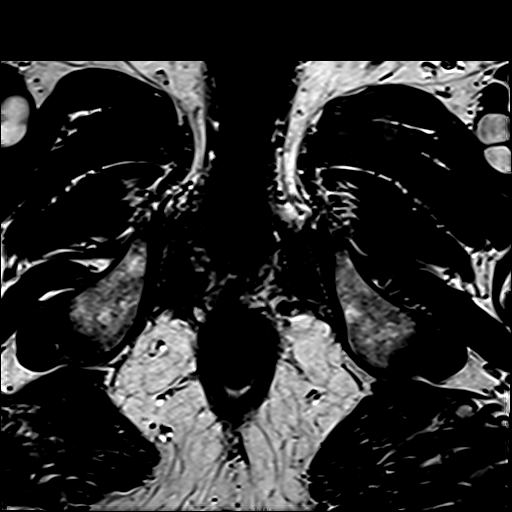
[im 30/30]
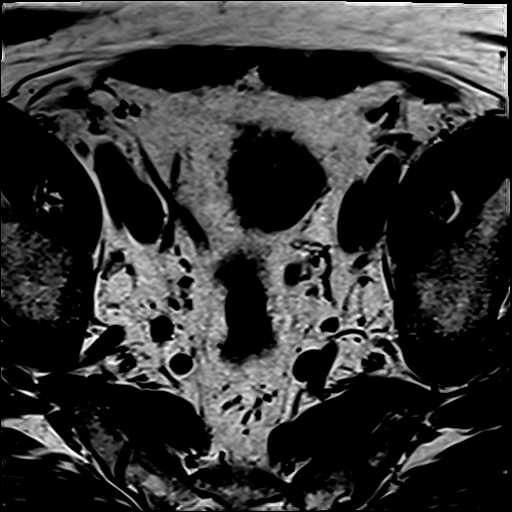

[Series 8: T2 · sagittal · 3.5mm · 0.62mm/px · 3 of 35 slices shown (2 of 3)]
[im 1/35]
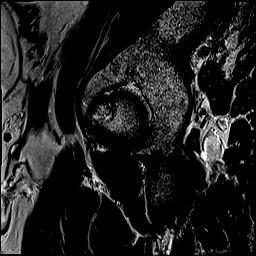
[im 18/35]
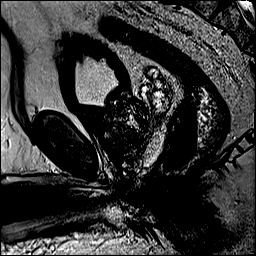
[im 35/35]
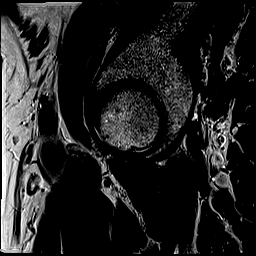

[Series 9: T2 · axial · 3.5mm · 0.56mm/px · z∈[+7,+84]mm · 2 of 23 slices shown (3 of 3)]
[im 1/23]
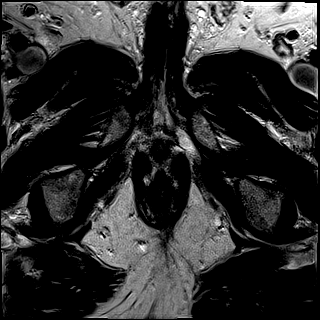
[im 23/23]
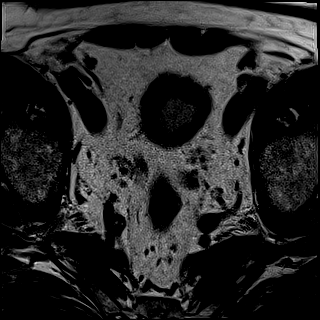

[Series 10: t2_space_tra (id) · axial · 1.0mm · 1.04mm/px · z∈[+6,+85]mm · 5 of 80 slices shown]
[im 1/80]
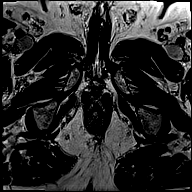
[im 20/80]
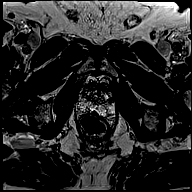
[im 40/80]
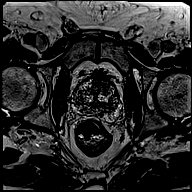
[im 60/80]
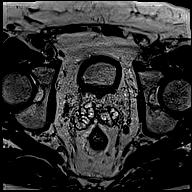
[im 80/80]
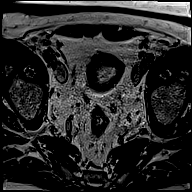

[Series 11: ax dwi_tracew · axial · 3.0mm · 0.78mm/px · 1 of 25 slices shown (1 of 3)]
[im 1/25]
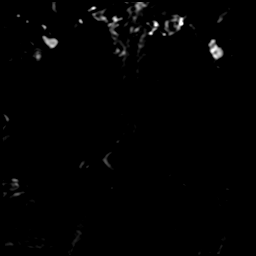

[Series 11: ax dwi_tracew · axial · 3.0mm · 0.78mm/px · 1 of 25 slices shown (2 of 3)]
[im 1/25]
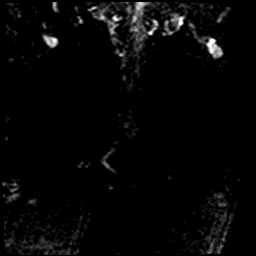

[Series 11: ax dwi_tracew · axial · 3.0mm · 0.78mm/px · 1 of 25 slices shown (3 of 3)]
[im 1/25]
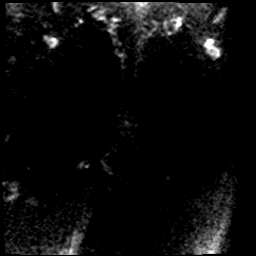

[Series 12: ax dwi_adc · axial · 3.0mm · 0.78mm/px · 1 of 25 slices shown]
[im 1/25]
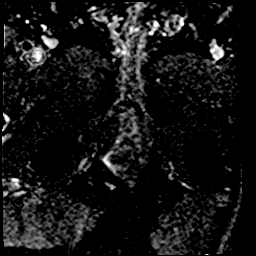

[Series 13: ax dwi_calc_bval · axial · 3.0mm · 0.78mm/px · 1 of 25 slices shown]
[im 1/25]
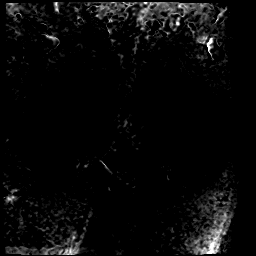

[Series 14: T1 · axial · 3.0mm · 1.15mm/px · 1 of 28 slices shown (3 of 27)]
[im 1/28]
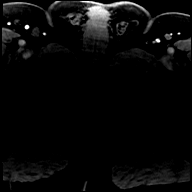

[Series 15: T1 · axial · 3.0mm · 1.15mm/px · 1 of 28 slices shown (4 of 27)]
[im 1/28]
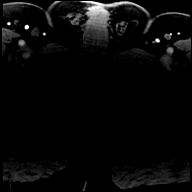

[Series 17: T1 · axial · 3.0mm · 1.15mm/px · 1 of 28 slices shown (5 of 27)]
[im 1/28]
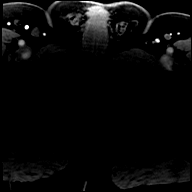

[Series 19: T1 · axial · 3.0mm · 1.15mm/px · 1 of 28 slices shown (6 of 27)]
[im 1/28]
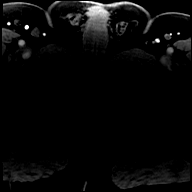

[Series 21: T1 · axial · 3.0mm · 1.15mm/px · 1 of 28 slices shown (7 of 27)]
[im 1/28]
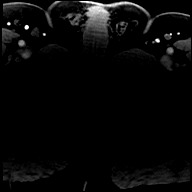

[Series 23: T1 · axial · 3.0mm · 1.15mm/px · 1 of 28 slices shown (8 of 27)]
[im 1/28]
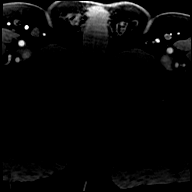

[Series 25: T1 · axial · 3.0mm · 1.15mm/px · 1 of 28 slices shown (9 of 27)]
[im 1/28]
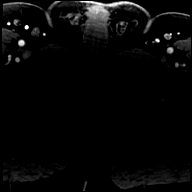

[Series 27: T1 · axial · 3.0mm · 1.15mm/px · 1 of 28 slices shown (10 of 27)]
[im 1/28]
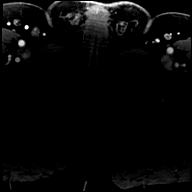

[Series 29: T1 · axial · 3.0mm · 1.15mm/px · 1 of 28 slices shown (11 of 27)]
[im 1/28]
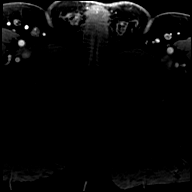

[Series 31: T1 · axial · 3.0mm · 1.15mm/px · 1 of 28 slices shown (12 of 27)]
[im 1/28]
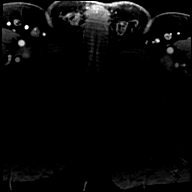

[Series 33: T1 · axial · 3.0mm · 1.15mm/px · 1 of 28 slices shown (13 of 27)]
[im 1/28]
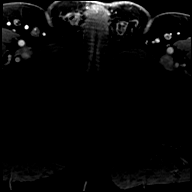

[Series 35: T1 · axial · 3.0mm · 1.15mm/px · 1 of 28 slices shown (14 of 27)]
[im 1/28]
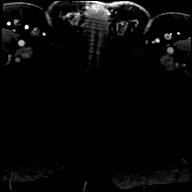

[Series 37: T1 · axial · 3.0mm · 1.15mm/px · 1 of 28 slices shown (15 of 27)]
[im 1/28]
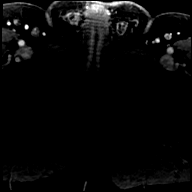

[Series 39: T1 · axial · 3.0mm · 1.15mm/px · 1 of 28 slices shown (16 of 27)]
[im 1/28]
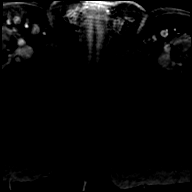

[Series 41: T1 · axial · 3.0mm · 1.15mm/px · 1 of 28 slices shown (17 of 27)]
[im 1/28]
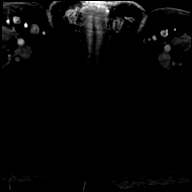

[Series 43: T1 · axial · 3.0mm · 1.15mm/px · 1 of 28 slices shown (18 of 27)]
[im 1/28]
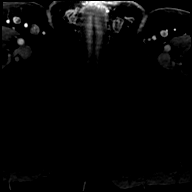

[Series 45: T1 · axial · 3.0mm · 1.15mm/px · 1 of 28 slices shown (19 of 27)]
[im 1/28]
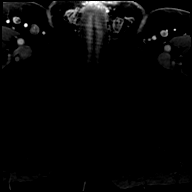

[Series 47: T1 · axial · 3.0mm · 1.15mm/px · 1 of 28 slices shown (20 of 27)]
[im 1/28]
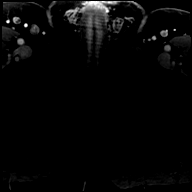

[Series 49: T1 · axial · 3.0mm · 1.15mm/px · 1 of 28 slices shown (21 of 27)]
[im 1/28]
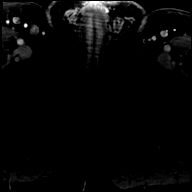

[Series 51: T1 · axial · 3.0mm · 1.15mm/px · 1 of 28 slices shown (22 of 27)]
[im 1/28]
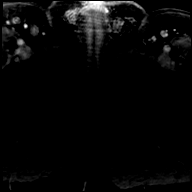

[Series 53: T1 · axial · 3.0mm · 1.15mm/px · 1 of 28 slices shown (23 of 27)]
[im 1/28]
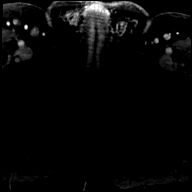

[Series 55: T1 · axial · 3.0mm · 1.15mm/px · 1 of 28 slices shown (24 of 27)]
[im 1/28]
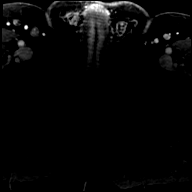

[Series 57: T1 · axial · 3.0mm · 1.15mm/px · 1 of 28 slices shown (25 of 27)]
[im 1/28]
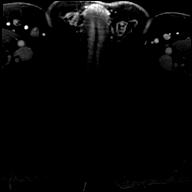

[Series 59: T1 · axial · 3.0mm · 1.15mm/px · 1 of 28 slices shown (26 of 27)]
[im 1/28]
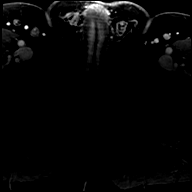

[Series 61: T1 · axial · 3.0mm · 1.15mm/px · 1 of 28 slices shown (27 of 27)]
[im 1/28]
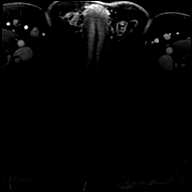

[48 of 48 positions shown; findings below may reference images not displayed]

FINDINGS: Prostate: Demonstrates mild to moderate central gland enlargement
and heterogeneity, consistent with benign prostatic hyperplasia. No
dominant central gland nodule. There is a mildly extruded left
apical nodule including at 1.0 cm on [DATE].

T2 hypointensity within the lateral right mid gland measures
maximally 8 mm on [DATE] and [DATE]. Corresponds to restricted diffusion
on [DATE] and early post-contrast enhancement on 17/25.

Volume: 5.6 x 4.2 x 4.2 cm (volume = 52 cm^3)

Transcapsular spread:  Absent

Seminal vesicle involvement: Absent

Neurovascular bundle involvement: Absent

Pelvic adenopathy: Absent

Bone metastasis: Absent

Other findings: Decompressed urinary bladder. No significant free
fluid.
IMPRESSION: 1. Multiparametric signal abnormality within the lateral right mid
gland peripheral zone. Suspicious for low volume higher grade
disease.
2. No findings of locally advanced or pelvic metastatic disease.

ADDENDUM:
The PI-RADS(v2.1) -category was inadvertently omitted. Findings are
most consistent with PI-RADS(v2.1)-4.

*** End of Addendum ***
FINDINGS: Prostate: Demonstrates mild to moderate central gland enlargement
and heterogeneity, consistent with benign prostatic hyperplasia. No
dominant central gland nodule. There is a mildly extruded left
apical nodule including at 1.0 cm on [DATE].

T2 hypointensity within the lateral right mid gland measures
maximally 8 mm on [DATE] and [DATE]. Corresponds to restricted diffusion
on [DATE] and early post-contrast enhancement on 17/25.

Volume: 5.6 x 4.2 x 4.2 cm (volume = 52 cm^3)

Transcapsular spread:  Absent

Seminal vesicle involvement: Absent

Neurovascular bundle involvement: Absent

Pelvic adenopathy: Absent

Bone metastasis: Absent

Other findings: Decompressed urinary bladder. No significant free
fluid.
IMPRESSION: 1. Multiparametric signal abnormality within the lateral right mid
gland peripheral zone. Suspicious for low volume higher grade
disease.
2. No findings of locally advanced or pelvic metastatic disease.

## 2021-01-23 ENCOUNTER — Ambulatory Visit: Payer: Self-pay

## 2021-01-23 ENCOUNTER — Encounter: Payer: Self-pay | Admitting: Urology

## 2021-01-23 ENCOUNTER — Other Ambulatory Visit: Payer: Self-pay

## 2021-01-23 ENCOUNTER — Ambulatory Visit (INDEPENDENT_AMBULATORY_CARE_PROVIDER_SITE_OTHER): Payer: Medicare Other | Admitting: Urology

## 2021-01-23 VITALS — BP 156/81 | HR 68 | Ht 69.0 in | Wt 215.0 lb

## 2021-01-23 DIAGNOSIS — C61 Malignant neoplasm of prostate: Secondary | ICD-10-CM

## 2021-01-23 NOTE — Progress Notes (Signed)
01/23/2021 9:06 AM   Antonio Vance TS:[id number]  Referring provider: [name], [address],  [place] [address]  Chief Complaint  Patient presents with   Prostate Cancer    Urologic history: 1.  T1c prostate cancer (NCCN intermediate risk favorable) -Slowly rising PSA, 8.39 02/2019 -Prostate MRI PI-RADS 4 lesion right mid PZ -Fusion biopsy 04/2019 ROI Gleason 3+4 adenocarcinoma in addition to 4 template biopsies positive Gleason 3+4 -Prostate volume 65 cc -Treated IMRT completed 08/08/2019  HPI: 75 y.o. male presents for prostate cancer follow-up.  Doing well since last visit No bothersome LUTS Denies dysuria, gross hematuria Denies flank, abdominal or pelvic pain Last Eligard 07/25/2020 Saw Dr. Baruch Gouty 12/26/2020; PSA 0.55   PMH: Past Medical History:  Diagnosis Date   Bradycardia    Chronic kidney disease    Hypertension     Surgical History: Past Surgical History:  Procedure Laterality Date   COLONOSCOPY     COLONOSCOPY N/A 12/28/2016   Procedure: COLONOSCOPY;  Surgeon: Lollie Sails, MD;  Location: [facility] ENDOSCOPY;  Service: Endoscopy;  Laterality: N/A;   ESOPHAGOGASTRODUODENOSCOPY (EGD) WITH PROPOFOL N/A 12/28/2016   Procedure: ESOPHAGOGASTRODUODENOSCOPY (EGD) WITH PROPOFOL;  Surgeon: Lollie Sails, MD;  Location: [facility] ENDOSCOPY;  Service: Endoscopy;  Laterality: N/A;   HERNIA REPAIR      Home Medications:  Allergies as of 01/23/2021   No Known Allergies      Medication List        Accurate as of January 23, 2021  9:06 AM. If you have any questions, ask your nurse or doctor.          amLODipine 10 MG tablet Commonly known as: NORVASC Take 10 mg by mouth daily.   ascorbic acid 500 MG tablet Commonly known as: VITAMIN C Take 500 mg by mouth daily.   aspirin 81 MG tablet Take 81 mg by mouth daily.   b complex vitamins capsule Take 1 capsule by mouth daily.   carvedilol 3.125 MG tablet Commonly known  as: COREG Take 3.125 mg by mouth 2 (two) times daily with a meal.   lisinopril 10 MG tablet Commonly known as: ZESTRIL Take 10 mg by mouth daily.   multivitamin tablet Take 1 tablet by mouth daily.   pantoprazole 40 MG tablet Commonly known as: PROTONIX Take 1 tablet (40 mg total) by mouth daily.        Allergies: No Known Allergies  Family History: Family History  Problem Relation Age of Onset   Hypertension Mother    Cancer Mother    Hypertension Father    Diabetes Brother    Hypertension Brother     Social History:  reports that he has quit smoking. He has never used smokeless tobacco. He reports that he does not drink alcohol and does not use drugs.   Physical Exam: BP (!) 156/81   Pulse 68   Ht '5\' 9"'$  (1.753 m)   Wt 215 lb (97.5 kg)   BMI 31.75 kg/m   Constitutional:  Alert and oriented, No acute distress. HEENT: [place] AT, moist mucus membranes.  Trachea midline, no masses. Cardiovascular: No clubbing, cyanosis, or edema. Respiratory: Normal respiratory effort, no increased work of breathing.   Assessment & Plan:    1.  T1c intermediate risk prostate cancer Status post IMRT +18 months ADT Recent PSA 0.55 Dr. Olena Leatherwood last note indicated to discontinue Eligard  He has a follow-up in radiation oncology January 2023 and will see him back  in 1 year with a PSA prior   Abbie Sons, MD  [facility] [address] [place], [place] [address] [phone]

## 2021-01-26 ENCOUNTER — Ambulatory Visit: Payer: Self-pay

## 2021-06-29 ENCOUNTER — Ambulatory Visit: Admit: 2021-06-29 | Discharge status: Absent without leave | Payer: Self-pay

## 2021-06-29 ENCOUNTER — Other Ambulatory Visit: Payer: Self-pay

## 2021-06-29 ENCOUNTER — Ambulatory Visit
Admission: EM | Admit: 2021-06-29 | Discharge: 2021-06-29 | Disposition: A | Discharge status: Home / Self Care | Payer: Managed Care, Other (non HMO) | Attending: Emergency Medicine | Admitting: Emergency Medicine

## 2021-06-29 ENCOUNTER — Ambulatory Visit (INDEPENDENT_AMBULATORY_CARE_PROVIDER_SITE_OTHER): Payer: Managed Care, Other (non HMO)

## 2021-06-29 DIAGNOSIS — M25531 Pain in right wrist: Secondary | ICD-10-CM

## 2021-06-29 DIAGNOSIS — W19XXXA Unspecified fall, initial encounter: Secondary | ICD-10-CM

## 2021-06-29 MED ORDER — DICLOFENAC SODIUM 1 % EX GEL: 2.0000 g | Freq: Four times a day (QID) | CUTANEOUS | 0 refills | Status: AC

## 2021-06-29 NOTE — Discharge Instructions (Signed)
Your x-ray today did not show injury to the bone, ligaments or tendons of your wrist. Your pain is most likely being caused by irritation to the soft tissues, this should improve as time progresses.   You may take Tylenol 500- 1000 mg every 6 hours as needed for comfort  You may use Voltaren gel up to 4 times a day (every 6 hours) to help with swelling and pain, recommend taking this medicine consistently for the next 3 days to truly reduce some of the inflammation present   You may apply heat or ice, whichever makes you feel better, to affected area in 15 minute intervals  You may continue activity as tolerated, there is no injury therefore, it is important that you continue to move around so you do not loose strength to the area  you may wrap ankle with wrist wrap for additional support while completing activities, once wrapped if you begin to experience numbness or tingling it is too tight, remove and redo, you should be able to easily fit one finger under wrap   If symptoms persist past 2 weeks, you may follow up at urgent care or with orthopedic specialist for evaluation, an orthopedic doctor specializes in the bone, they may provide  management such as but not limited to imaging, long term medications and physical therapy

## 2021-06-29 NOTE — ED Provider Notes (Signed)
[facility]    CSN: [id number] Arrival date & time: 06/29/21  1503      History   Chief Complaint Chief Complaint  Patient presents with   Wrist Injury    Right     HPI Antonio Vance is a 75 y.o. male.   Patient presents with right wrist pain and swelling occurring after fall 3 days ago.  Was attempting to retrieve trash can when he lost balance caught body on palmar aspect of the right hand and wrist.  Endorses numbness and tingling of the second third and fourth digit.  Range of motion is intact but elicits pain.  Has attempted use of Tylenol which has been minimally helpful.   Past Medical History:  Diagnosis Date   Bradycardia    Chronic kidney disease    Hypertension     Patient Active Problem List   Diagnosis Date Noted   Prostate cancer ([place]) 04/23/2019   Hyperlipidemia, mixed 12/08/2018   LVH (left ventricular hypertrophy) due to hypertensive disease, without heart failure 12/08/2018   Atherosclerosis of abdominal aorta ([place]) 04/21/2018   Bradycardia 04/21/2018   Thoracic aortic aneurysm without rupture 04/21/2018   Troponin I above reference range 04/07/2018   Bilateral hand numbness 11/26/2015   Kidney disease 10/14/2014   Erectile dysfunction of nonorganic origin 10/14/2014   Benign essential hypertension 10/14/2014   Obesity (BMI 30-39.9) 09/11/2014   Hypertension 09/11/2014   CKD (chronic kidney disease) stage 3, GFR 30-59 ml/min ([place]) 09/11/2014    Past Surgical History:  Procedure Laterality Date   COLONOSCOPY     COLONOSCOPY N/A 12/28/2016   Procedure: COLONOSCOPY;  Surgeon: Lollie Sails, MD;  Location[facility] ENDOSCOPY;  Service: Endoscopy;  Laterality: N/A;   ESOPHAGOGASTRODUODENOSCOPY (EGD) WITH PROPOFOL N/A 12/28/2016   Procedure: ESOPHAGOGASTRODUODENOSCOPY (EGD) WITH PROPOFOL;  Surgeon: Lollie Sails, MD;  Location: [facility] ENDOSCOPY;  Service: Endoscopy;  Laterality: N/A;   HERNIA REPAIR         Home Medications     Prior to Admission medications   Medication Sig Start Date End Date Taking? Authorizing Provider  amLODipine (NORVASC) 10 MG tablet Take 10 mg by mouth daily.  09/11/14  Yes [provider]  ascorbic acid (VITAMIN C) 500 MG tablet Take 500 mg by mouth daily.   Yes [provider]  aspirin 81 MG tablet Take 81 mg by mouth daily.    Yes [provider]  b complex vitamins capsule Take 1 capsule by mouth daily.    Yes [provider]  carvedilol (COREG) 3.125 MG tablet Take 3.125 mg by mouth 2 (two) times daily with a meal.   Yes [provider]  lisinopril (PRINIVIL,ZESTRIL) 10 MG tablet Take 10 mg by mouth daily.   Yes [provider]  Multiple Vitamin (MULTIVITAMIN) tablet Take 1 tablet by mouth daily.   Yes [provider]  pantoprazole (PROTONIX) 40 MG tablet Take 1 tablet (40 mg total) by mouth daily. 04/08/18  Yes Dustin Flock, MD    Family History Family History  Problem Relation Age of Onset   Hypertension Mother    Cancer Mother    Hypertension Father    Diabetes Brother    Hypertension Brother     Social History Social History   Tobacco Use   Smoking status: Former   Smokeless tobacco: Never  Scientific laboratory technician Use: Never used  Substance Use Topics   Alcohol use: No   Drug use: No  Allergies   Patient has no known allergies.   Review of Systems Review of Systems  Constitutional: Negative.   Respiratory: Negative.    Skin: Negative.   Neurological: Negative.     Physical Exam Triage Vital Signs ED Triage Vitals  Enc Vitals Group     BP 06/29/21 1521 (!) 161/68     Pulse Rate 06/29/21 1521 67     Resp 06/29/21 1521 18     Temp 06/29/21 1521 98.6 F (37 C)     Temp Source 06/29/21 1521 Oral     SpO2 06/29/21 1521 100 %     Weight 06/29/21 1517 195 lb (88.5 kg)     Height --      Head Circumference --      Peak Flow --      Pain Score 06/29/21 1517 4     Pain Loc --      Pain  Edu? --      Excl. in [place]? --    No data found.  Updated Vital Signs BP (!) 161/68 (BP Location: Left Arm)    Pulse 67    Temp 98.6 F (37 C) (Oral)    Resp 18    Wt 195 lb (88.5 kg)    SpO2 100%    BMI 28.80 kg/m   Visual Acuity Right Eye Distance:   Left Eye Distance:   Bilateral Distance:    Right Eye Near:   Left Eye Near:    Bilateral Near:     Physical Exam Constitutional:      Appearance: Normal appearance. He is normal weight.  HENT:     Head: Normocephalic.  Eyes:     Extraocular Movements: Extraocular movements intact.  Pulmonary:     Effort: Pulmonary effort is normal.  Musculoskeletal:     Comments: Point tenderness over the radial aspect of the right wrist, mild to moderate swelling present over the right wrist extending midway into the right hand, sensation is intact, 2+ radial pulse, range of motion is intact but elicits pain  Skin:    General: Skin is warm and dry.  Neurological:     Mental Status: He is alert and oriented to person, place, and time. Mental status is at baseline.  Psychiatric:        Mood and Affect: Mood normal.        Behavior: Behavior normal.     UC Treatments / Results  Labs (all labs ordered are listed, but only abnormal results are displayed) Labs Reviewed - No data to display  EKG   Radiology DG Wrist Complete Right  Result Date: 06/29/2021 CLINICAL DATA:  Fall EXAM: RIGHT WRIST - COMPLETE 3+ VIEW COMPARISON:  None. FINDINGS: Old radial styloid fracture. Degenerative changes in the radiocarpal joint. No acute fracture, subluxation or dislocation. Soft tissues are intact. IMPRESSION: No acute bony abnormality. Electronically Signed   By: Rolm Baptise M.D.   On: 06/29/2021 15:43    Procedures Procedures (including critical care time)  Medications Ordered in UC Medications - No data to display  Initial Impression / Assessment and Plan / UC Course  I have reviewed the triage vital signs and the nursing  notes.  Pertinent labs & imaging results that were available during my care of the patient were reviewed by me and considered in my medical decision making (see chart for details).  Acute right wrist pain  1.  X-ray negative, reviewed findings with patient and daughter 2.  May continue use  of over-the-counter Tylenol for pain management 3.  Voltaren gel 1% 4 times daily as needed, will avoid oral NSAIDs due to kidney disease 4.  RICE recommended, heat in 15-minute intervals, activity as tolerated 5.  Work note given 6.  Urgent care orthopedic follow-up as needed Final Clinical Impressions(s) / UC Diagnoses   Final diagnoses:  None   Discharge Instructions   None    ED Prescriptions   None    PDMP not reviewed this encounter.   Hans Eden, NP 06/29/21 1909

## 2021-06-29 NOTE — ED Triage Notes (Signed)
Patient is here for "Right Wrist Injury". Fall "[id number]". Lost balance when trying to reach for trash can, catching himself on hand/wrist. No injury to knee "but limping" (according to daughter). Swelling/pain (wrist). Place of Injury: Home. Time: "11-12noon".

## 2021-07-03 ENCOUNTER — Inpatient Hospital Stay: Payer: Managed Care, Other (non HMO) | Attending: Radiation Oncology

## 2021-07-03 ENCOUNTER — Other Ambulatory Visit: Payer: Self-pay

## 2021-07-03 DIAGNOSIS — C61 Malignant neoplasm of prostate: Secondary | ICD-10-CM | POA: Diagnosis present

## 2021-07-03 LAB — PSA: Prostatic Specific Antigen: 0.45 ng/mL (ref 0.00–4.00)

## 2021-07-10 ENCOUNTER — Encounter: Payer: Self-pay | Admitting: Radiation Oncology

## 2021-07-10 ENCOUNTER — Ambulatory Visit
Admission: RE | Admit: 2021-07-10 | Discharge: 2021-07-10 | Disposition: A | Discharge status: Home / Self Care | Payer: Managed Care, Other (non HMO) | Source: Ambulatory Visit | Attending: Radiation Oncology | Admitting: Radiation Oncology

## 2021-07-10 ENCOUNTER — Other Ambulatory Visit: Payer: Self-pay

## 2021-07-10 VITALS — BP 177/76 | HR 64 | Temp 97.4°F | Wt 215.1 lb

## 2021-07-10 DIAGNOSIS — C61 Malignant neoplasm of prostate: Secondary | ICD-10-CM | POA: Insufficient documentation

## 2021-07-10 DIAGNOSIS — Z923 Personal history of irradiation: Secondary | ICD-10-CM | POA: Diagnosis not present

## 2021-07-10 NOTE — Progress Notes (Signed)
Radiation Oncology Follow up Note  Name: Antonio Vance   Date:   07/10/2021 MRN:  [id number] DOB: December 29, 1945    This 76 y.o. male presents to the clinic today for 2-year follow-up status post external beam radiation therapy image guided IMRT for stage IIa Gleason 7 (3+4) adenocarcinoma presenting with a PSA of 8.4.  REFERRING PROVIDER: [name], MD  HPI: Patient is a 76 year old male now out 2 years having image guided IMRT radiation therapy to his prostate for Gleason 7 (3+4) adenocarcinoma presenting with a PSA of 8.4 seen today in routine follow-up he is doing well specifically denies any increased lower urinary tract symptoms diarrhea or fatigue.  Most recent PSA is 0.45 down from 5.55 to 6 months prior and 3.291-year prior.  He is not had Eligard for over a year.  COMPLICATIONS OF TREATMENT: none  FOLLOW UP COMPLIANCE: keeps appointments   PHYSICAL EXAM:  BP (!) 177/76    Pulse 64    Temp (!) 97.4 F (36.3 C) (Tympanic)    Wt 215 lb 1.6 oz (97.6 kg)    BMI 31.76 kg/m  Well-developed well-nourished patient in NAD. HEENT reveals PERLA, EOMI, discs not visualized.  Oral cavity is clear. No oral mucosal lesions are identified. Neck is clear without evidence of cervical or supraclavicular adenopathy. Lungs are clear to A&P. Cardiac examination is essentially unremarkable with regular rate and rhythm without murmur rub or thrill. Abdomen is benign with no organomegaly or masses noted. Motor sensory and DTR levels are equal and symmetric in the upper and lower extremities. Cranial nerves II through XII are grossly intact. Proprioception is intact. No peripheral adenopathy or edema is identified. No motor or sensory levels are noted. Crude visual fields are within normal range.  RADIOLOGY RESULTS: No current films to review  PLAN: Present time patient is doing well under excellent biochemical control of his prostate cancer based on the fluctuation of his PSA I will see him back in 6 months  for follow-up.  If the trend continues down will start once year follow-up visits.  Patient comprehends my recommendations well.  He knows to call with any concerns.  I would like to take this opportunity to thank you for allowing me to participate in the care of your patient.[name], MD

## 2021-08-14 ENCOUNTER — Encounter: Payer: Self-pay | Admitting: *Deleted

## 2021-10-14 ENCOUNTER — Other Ambulatory Visit: Payer: Self-pay | Admitting: Internal Medicine

## 2021-10-14 DIAGNOSIS — R0602 Shortness of breath: Secondary | ICD-10-CM

## 2021-10-14 DIAGNOSIS — I7121 Aneurysm of the ascending aorta, without rupture: Secondary | ICD-10-CM

## 2021-11-13 ENCOUNTER — Telehealth (HOSPITAL_COMMUNITY): Payer: Self-pay | Admitting: Emergency Medicine

## 2021-11-13 ENCOUNTER — Encounter (HOSPITAL_COMMUNITY): Payer: Self-pay

## 2021-11-13 NOTE — Telephone Encounter (Signed)
Reaching out to patient to offer assistance regarding upcoming cardiac imaging study; pt verbalizes understanding of appt date/time, parking situation and where to check in, pre-test NPO status and medications ordered, and verified current allergies; name and call back number provided for further questions should they arise ?Marchia Bond RN Navigator Cardiac Imaging ?[facility] ?[phone] office ?[phone] cell ? ?Denies iv issues ?Daily meds ?Arrival 745 ? ?

## 2021-11-13 NOTE — Telephone Encounter (Signed)
Attempted to call patient regarding upcoming cardiac CT appointment. °Left message on voicemail with name and callback number °Toryn Dewalt RN Navigator Cardiac Imaging °[facility] °[phone] Office °[phone] Cell ° °

## 2021-11-16 ENCOUNTER — Ambulatory Visit
Admission: RE | Admit: 2021-11-16 | Discharge: 2021-11-16 | Disposition: A | Discharge status: Home / Self Care | Payer: Managed Care, Other (non HMO) | Source: Ambulatory Visit | Attending: Internal Medicine | Admitting: Internal Medicine

## 2021-11-16 DIAGNOSIS — R0602 Shortness of breath: Secondary | ICD-10-CM | POA: Diagnosis present

## 2021-11-16 DIAGNOSIS — I7121 Aneurysm of the ascending aorta, without rupture: Secondary | ICD-10-CM | POA: Diagnosis present

## 2021-11-16 LAB — POCT I-STAT CREATININE: Creatinine, Ser: 2.3 mg/dL — ABNORMAL HIGH (ref 0.61–1.24)

## 2021-11-16 MED ORDER — NITROGLYCERIN 0.4 MG SL SUBL: 0.8000 mg | SUBLINGUAL_TABLET | Freq: Once | SUBLINGUAL | Status: DC

## 2022-01-15 ENCOUNTER — Other Ambulatory Visit: Payer: Medicare Other

## 2022-01-15 DIAGNOSIS — C61 Malignant neoplasm of prostate: Secondary | ICD-10-CM

## 2022-01-16 LAB — PSA: Prostate Specific Ag, Serum: 0.5 ng/mL (ref 0.0–4.0)

## 2022-01-22 ENCOUNTER — Ambulatory Visit: Payer: Self-pay | Admitting: Urology

## 2022-01-22 ENCOUNTER — Ambulatory Visit: Payer: Managed Care, Other (non HMO) | Admitting: Radiation Oncology

## 2022-01-29 ENCOUNTER — Ambulatory Visit (INDEPENDENT_AMBULATORY_CARE_PROVIDER_SITE_OTHER): Payer: 59 | Admitting: Urology

## 2022-01-29 ENCOUNTER — Encounter: Payer: Self-pay | Admitting: Urology

## 2022-01-29 ENCOUNTER — Encounter: Payer: Self-pay | Admitting: Radiation Oncology

## 2022-01-29 ENCOUNTER — Ambulatory Visit: Payer: 59 | Admitting: Urology

## 2022-01-29 ENCOUNTER — Ambulatory Visit
Admission: RE | Admit: 2022-01-29 | Discharge: 2022-01-29 | Disposition: A | Discharge status: Home / Self Care | Payer: Managed Care, Other (non HMO) | Source: Ambulatory Visit | Attending: Radiation Oncology | Admitting: Radiation Oncology

## 2022-01-29 ENCOUNTER — Other Ambulatory Visit: Payer: Self-pay | Admitting: *Deleted

## 2022-01-29 VITALS — BP 125/71 | HR 69 | Ht 70.0 in | Wt 211.0 lb

## 2022-01-29 VITALS — BP 127/55 | HR 42 | Temp 98.7°F | Resp 16 | Ht 70.0 in | Wt 211.0 lb

## 2022-01-29 DIAGNOSIS — C61 Malignant neoplasm of prostate: Secondary | ICD-10-CM

## 2022-01-29 DIAGNOSIS — Z8546 Personal history of malignant neoplasm of prostate: Secondary | ICD-10-CM | POA: Diagnosis not present

## 2022-01-29 DIAGNOSIS — Z923 Personal history of irradiation: Secondary | ICD-10-CM | POA: Insufficient documentation

## 2022-01-29 NOTE — Progress Notes (Signed)
Radiation Oncology Follow up Note  Name: Antonio Vance   Date:   01/29/2022 MRN:  [id number] DOB: Aug 05, 1945    This 76 y.o. male presents to the clinic today for 2-1/2-year follow-up status post external beam radiation therapy for stage IIa Gleason 7 (3+4) adenocarcinoma prostate presenting with a PSA of 8.4  REFERRING PROVIDER: [name], MD  HPI: Patient is a 76 year old male now out over 2-1/2 years having completed IMRT radiation therapy to his prostate for Gleason 7 adenocarcinoma seen today in routine follow-up he is doing well specifically denies any increased lower urinary tract symptoms diarrhea or fatigue.Marland Kitchen  His PSA remains constant over the past several years at 0.5. COMPLICATIONS OF TREATMENT: none  FOLLOW UP COMPLIANCE: keeps appointments   PHYSICAL EXAM:  BP (!) 127/55 (BP Location: Right Arm, Patient Position: Sitting, Cuff Size: Normal)   Pulse (!) 42   Temp 98.7 F (37.1 C) (Tympanic)   Resp 16   Ht '5\' 10"'$  (1.778 m) Comment: stated ht  Wt 211 lb (95.7 kg)   BMI 30.28 kg/m  Well-developed well-nourished patient in NAD. HEENT reveals PERLA, EOMI, discs not visualized.  Oral cavity is clear. No oral mucosal lesions are identified. Neck is clear without evidence of cervical or supraclavicular adenopathy. Lungs are clear to A&P. Cardiac examination is essentially unremarkable with regular rate and rhythm without murmur rub or thrill. Abdomen is benign with no organomegaly or masses noted. Motor sensory and DTR levels are equal and symmetric in the upper and lower extremities. Cranial nerves II through XII are grossly intact. Proprioception is intact. No peripheral adenopathy or edema is identified. No motor or sensory levels are noted. Crude visual fields are within normal range.  RADIOLOGY RESULTS: No current films to review  PLAN: Present time patient is under excellent biochemical control of his prostate cancer over time.  I am pleased with his overall progress is a  very low side effect profile.  Of asked to see him back in 1 year for follow-up with a PSA at that time.  Patient knows to call with any concerns.  I would like to take this opportunity to thank you for allowing me to participate in the care of your patient.[name], MD

## 2022-01-29 NOTE — Progress Notes (Signed)
01/29/2022 11:24 AM   Antonio Vance 08-Mar-1946 [id number]  Referring provider: [name], [place],  [place] [address]  Chief Complaint  Patient presents with   Prostate Cancer    Urologic history: 1.  T1c prostate cancer (NCCN intermediate risk favorable) -Slowly rising PSA, 8.39 02/2019 -Prostate MRI PI-RADS 4 lesion right mid PZ -Fusion biopsy 04/2019 ROI Gleason 3+4 adenocarcinoma in addition to 4 template biopsies positive Gleason 3+4 -Prostate volume 65 cc -Treated IMRT completed 08/08/2019  HPI: 76 y.o. male presents for prostate cancer follow-up.  Doing well since last visit No bothersome LUTS Denies dysuria, gross hematuria Denies flank, abdominal or pelvic pain Last Eligard 07/25/2020 PSA 01/15/2022 stable at 0.5   PMH: Past Medical History:  Diagnosis Date   Bradycardia    Chronic kidney disease    Hypertension     Surgical History: Past Surgical History:  Procedure Laterality Date   COLONOSCOPY     COLONOSCOPY N/A 12/28/2016   Procedure: COLONOSCOPY;  Surgeon: Lollie Sails, MD;  Location: [facility] ENDOSCOPY;  Service: Endoscopy;  Laterality: N/A;   ESOPHAGOGASTRODUODENOSCOPY (EGD) WITH PROPOFOL N/A 12/28/2016   Procedure: ESOPHAGOGASTRODUODENOSCOPY (EGD) WITH PROPOFOL;  Surgeon: Lollie Sails, MD;  Location: [facility] ENDOSCOPY;  Service: Endoscopy;  Laterality: N/A;   HERNIA REPAIR      Home Medications:  Allergies as of 01/29/2022   No Known Allergies      Medication List        Accurate as of January 29, 2022 11:24 AM. If you have any questions, ask your nurse or doctor.          amLODipine 10 MG tablet Commonly known as: NORVASC Take 10 mg by mouth daily.   ascorbic acid 500 MG tablet Commonly known as: VITAMIN C Take 500 mg by mouth daily.   aspirin 81 MG tablet Take 81 mg by mouth daily.   b complex vitamins capsule Take 1 capsule by mouth daily.   carvedilol 3.125 MG tablet Commonly known as:  COREG Take 3.125 mg by mouth 2 (two) times daily with a meal.   diclofenac Sodium 1 % Gel Commonly known as: VOLTAREN Apply 2 g topically 4 (four) times daily.   lisinopril 10 MG tablet Commonly known as: ZESTRIL Take 10 mg by mouth daily.   multivitamin tablet Take 1 tablet by mouth daily.   pantoprazole 40 MG tablet Commonly known as: PROTONIX Take 1 tablet (40 mg total) by mouth daily.        Allergies: No Known Allergies  Family History: Family History  Problem Relation Age of Onset   Hypertension Mother    Cancer Mother    Hypertension Father    Diabetes Brother    Hypertension Brother     Social History:  reports that he has quit smoking. He has never used smokeless tobacco. He reports that he does not drink alcohol and does not use drugs.   Physical Exam: BP 125/71   Pulse 69   Ht '5\' 10"'$  (1.778 m)   Wt 211 lb (95.7 kg)   BMI 30.28 kg/m   Constitutional:  Alert and oriented, No acute distress. HEENT: [place] AT, moist mucus membranes.  Trachea midline, no masses. Cardiovascular: No clubbing, cyanosis, or edema. Respiratory: Normal respiratory effort, no increased work of breathing.   Assessment & Plan:    1.  T1c intermediate risk prostate cancer Status post IMRT +18 months ADT Stable PSA Actually saw a radiation oncology today and is  scheduled for a 1 year follow-up We will see back in 6 months with Reyno, MD  [facility] [address], [place], [place] [address] [phone]

## 2022-07-23 ENCOUNTER — Other Ambulatory Visit: Payer: Managed Care, Other (non HMO)

## 2022-08-02 ENCOUNTER — Other Ambulatory Visit: Payer: Medicare Other

## 2022-08-05 ENCOUNTER — Other Ambulatory Visit: Payer: Self-pay | Admitting: *Deleted

## 2022-08-05 ENCOUNTER — Ambulatory Visit: Payer: Medicare Other | Admitting: Urology

## 2022-08-05 DIAGNOSIS — C61 Malignant neoplasm of prostate: Secondary | ICD-10-CM

## 2022-08-06 ENCOUNTER — Other Ambulatory Visit: Payer: Medicare Other

## 2022-08-06 DIAGNOSIS — C61 Malignant neoplasm of prostate: Secondary | ICD-10-CM

## 2022-08-07 LAB — PSA: Prostate Specific Ag, Serum: 0.5 ng/mL (ref 0.0–4.0)

## 2022-08-13 ENCOUNTER — Ambulatory Visit (INDEPENDENT_AMBULATORY_CARE_PROVIDER_SITE_OTHER): Payer: Managed Care, Other (non HMO) | Admitting: Urology

## 2022-08-13 ENCOUNTER — Encounter: Payer: Self-pay | Admitting: Urology

## 2022-08-13 VITALS — BP 167/72 | HR 71 | Ht 70.0 in | Wt 211.0 lb

## 2022-08-13 DIAGNOSIS — C61 Malignant neoplasm of prostate: Secondary | ICD-10-CM | POA: Diagnosis not present

## 2022-08-13 NOTE — Progress Notes (Signed)
08/13/2022 10:44 AM   Antonio Vance [birth year] [id number]  Referring provider: [name], [place],  [place] [address]  Chief Complaint  Patient presents with   Elevated PSA    Urologic history: 1.  T1c prostate cancer (NCCN intermediate risk favorable) -Slowly rising PSA, 8.39 02/2019 -Prostate MRI PI-RADS 4 lesion right mid PZ -Fusion biopsy 04/2019 ROI Gleason 3+4 adenocarcinoma in addition to 4 template biopsies positive Gleason 3+4 -Prostate volume 65 cc -Treated IMRT completed 08/08/2019  HPI: 77 y.o. male presents for prostate cancer follow-up.  Doing well since last visit No bothersome LUTS Denies dysuria, gross hematuria Denies flank, abdominal or pelvic pain Last Eligard 07/25/2020 PSA 08/06/2022 stable at 0.5   PMH: Past Medical History:  Diagnosis Date   Bradycardia    Chronic kidney disease    Hypertension     Surgical History: Past Surgical History:  Procedure Laterality Date   COLONOSCOPY     COLONOSCOPY N/A 12/28/2016   Procedure: COLONOSCOPY;  Surgeon: Lollie Sails, MD;  Location: [facility] ENDOSCOPY;  Service: Endoscopy;  Laterality: N/A;   ESOPHAGOGASTRODUODENOSCOPY (EGD) WITH PROPOFOL N/A 12/28/2016   Procedure: ESOPHAGOGASTRODUODENOSCOPY (EGD) WITH PROPOFOL;  Surgeon: Lollie Sails, MD;  Location: [facility] ENDOSCOPY;  Service: Endoscopy;  Laterality: N/A;   HERNIA REPAIR      Home Medications:  Allergies as of 08/13/2022   No Known Allergies      Medication List        Accurate as of August 13, 2022 10:44 AM. If you have any questions, ask your nurse or doctor.          amLODipine 10 MG tablet Commonly known as: NORVASC Take 10 mg by mouth daily.   ascorbic acid 500 MG tablet Commonly known as: VITAMIN C Take 500 mg by mouth daily.   aspirin 81 MG tablet Take 81 mg by mouth daily.   b complex vitamins capsule Take 1 capsule by mouth daily.   carvedilol 3.125 MG tablet Commonly known as:  COREG Take 3.125 mg by mouth 2 (two) times daily with a meal.   diclofenac Sodium 1 % Gel Commonly known as: VOLTAREN Apply 2 g topically 4 (four) times daily.   lisinopril 10 MG tablet Commonly known as: ZESTRIL Take 10 mg by mouth daily.   multivitamin tablet Take 1 tablet by mouth daily.   pantoprazole 40 MG tablet Commonly known as: PROTONIX Take 1 tablet (40 mg total) by mouth daily.        Allergies: No Known Allergies  Family History: Family History  Problem Relation Age of Onset   Hypertension Mother    Cancer Mother    Hypertension Father    Diabetes Brother    Hypertension Brother     Social History:  reports that he has quit smoking. He has never used smokeless tobacco. He reports that he does not drink alcohol and does not use drugs.   Physical Exam: BP (!) 167/72   Pulse 71   Ht 5' 10"$  (1.778 m)   Wt 211 lb (95.7 kg)   BMI 30.28 kg/m   Constitutional:  Alert and oriented, No acute distress. HEENT: [place] AT, moist mucus membranes.  Trachea midline, no masses. Cardiovascular: No clubbing, cyanosis, or edema. Respiratory: Normal respiratory effort, no increased work of breathing.   Assessment & Plan:    1.  T1c intermediate risk prostate cancer Status post IMRT +18 months ADT Stable PSA Radiation oncology appointment July 2024 Follow-up with  me 1 year with PSA   Abbie Sons, MD  [facility] [address], [place], [place] [address] [phone]

## 2023-01-20 ENCOUNTER — Other Ambulatory Visit: Payer: Managed Care, Other (non HMO)

## 2023-01-21 ENCOUNTER — Inpatient Hospital Stay: Payer: Managed Care, Other (non HMO) | Attending: Radiation Oncology

## 2023-01-21 DIAGNOSIS — C61 Malignant neoplasm of prostate: Secondary | ICD-10-CM | POA: Insufficient documentation

## 2023-01-21 LAB — PSA: Prostatic Specific Antigen: 0.41 ng/mL (ref 0.00–4.00)

## 2023-01-27 ENCOUNTER — Ambulatory Visit: Payer: Managed Care, Other (non HMO) | Admitting: Radiation Oncology

## 2023-02-03 ENCOUNTER — Ambulatory Visit: Payer: Managed Care, Other (non HMO) | Admitting: Radiation Oncology

## 2023-07-29 ENCOUNTER — Other Ambulatory Visit: Payer: Self-pay | Admitting: *Deleted

## 2023-07-29 DIAGNOSIS — C61 Malignant neoplasm of prostate: Secondary | ICD-10-CM

## 2023-08-12 ENCOUNTER — Other Ambulatory Visit: Payer: Self-pay

## 2023-08-15 ENCOUNTER — Other Ambulatory Visit: Payer: Managed Care, Other (non HMO)

## 2023-08-15 DIAGNOSIS — C61 Malignant neoplasm of prostate: Secondary | ICD-10-CM

## 2023-08-16 LAB — PSA: Prostate Specific Ag, Serum: 0.4 ng/mL (ref 0.0–4.0)

## 2023-08-19 ENCOUNTER — Encounter: Payer: Self-pay | Admitting: Urology

## 2023-08-19 ENCOUNTER — Ambulatory Visit: Payer: Managed Care, Other (non HMO) | Admitting: Urology

## 2023-08-19 VITALS — BP 161/76 | HR 57 | Ht 72.0 in | Wt 210.0 lb

## 2023-08-19 DIAGNOSIS — Z8546 Personal history of malignant neoplasm of prostate: Secondary | ICD-10-CM | POA: Diagnosis not present

## 2023-08-19 DIAGNOSIS — Z08 Encounter for follow-up examination after completed treatment for malignant neoplasm: Secondary | ICD-10-CM

## 2023-08-19 DIAGNOSIS — C61 Malignant neoplasm of prostate: Secondary | ICD-10-CM

## 2023-08-19 NOTE — Progress Notes (Signed)
 I, Maysun Anabel Bene, acting as a scribe for Riki Altes, MD., have documented all relevant documentation on the behalf of Riki Altes, MD, as directed by Riki Altes, MD while in the presence of Riki Altes, MD.  08/19/2023 9:05 AM   Antonio Vance 02/03/1946 [id number]  Referring provider: Leanna Sato, MD [address] [place],  [place] [address]  Chief Complaint  Patient presents with   Prostate Cancer   Urologic history: 1.  T1c prostate cancer (NCCN intermediate risk favorable) Slowly rising PSA, 8.39 02/2019 Prostate MRI PI-RADS 4 lesion right mid PZ Fusion biopsy 04/2019 ROI Gleason 3+4 adenocarcinoma in addition to 4 template biopsies positive Gleason 3+4 Prostate volume 65 cc Treated IMRT completed 08/08/2019 Last Eligard 07/25/2020  HPI: Antonio Vance is a 78 y.o. male presents for annual follow-up  Doing well since last visit No bothersome LUTS Denies dysuria, gross hematuria Denies flank, abdominal or pelvic pain PSA 08/15/23 stable at 0.4  PSA trend   Prostate Specific Ag, Serum  Latest Ref Rng 0.0 - 4.0 ng/mL  01/15/2022 0.5   08/06/2022 0.5   08/15/2023 0.4      PMH: Past Medical History:  Diagnosis Date   Bradycardia    Chronic kidney disease    Hypertension     Surgical History: Past Surgical History:  Procedure Laterality Date   COLONOSCOPY     COLONOSCOPY N/A 12/28/2016   Procedure: COLONOSCOPY;  Surgeon: Christena Deem, MD;  Location[facility] ENDOSCOPY;  Service: Endoscopy;  Laterality: N/A;   ESOPHAGOGASTRODUODENOSCOPY (EGD) WITH PROPOFOL N/A 12/28/2016   Procedure: ESOPHAGOGASTRODUODENOSCOPY (EGD) WITH PROPOFOL;  Surgeon: Christena Deem, MD;  Location: [facility] ENDOSCOPY;  Service: Endoscopy;  Laterality: N/A;   HERNIA REPAIR      Home Medications:  Allergies as of 08/19/2023   No Known Allergies      Medication List        Accurate as of August 19, 2023  9:05 AM. If you have any questions, ask your nurse or  doctor.          amLODipine 10 MG tablet Commonly known as: NORVASC Take 10 mg by mouth daily.   ascorbic acid 500 MG tablet Commonly known as: VITAMIN C Take 500 mg by mouth daily.   aspirin 81 MG tablet Take 81 mg by mouth daily.   b complex vitamins capsule Take 1 capsule by mouth daily.   carvedilol 3.125 MG tablet Commonly known as: COREG Take 3.125 mg by mouth 2 (two) times daily with a meal.   diclofenac Sodium 1 % Gel Commonly known as: VOLTAREN Apply 2 g topically 4 (four) times daily.   lisinopril 10 MG tablet Commonly known as: ZESTRIL Take 10 mg by mouth daily.   multivitamin tablet Take 1 tablet by mouth daily.   pantoprazole 40 MG tablet Commonly known as: PROTONIX Take 1 tablet (40 mg total) by mouth daily.        Allergies: No Known Allergies  Family History: Family History  Problem Relation Age of Onset   Hypertension Mother    Cancer Mother    Hypertension Father    Diabetes Brother    Hypertension Brother     Social History:  reports that he has quit smoking. He has never used smokeless tobacco. He reports that he does not drink alcohol and does not use drugs.   Physical Exam: BP (!) 161/76   Pulse (!) 57   Ht 6' (1.829  m)   Wt 210 lb (95.3 kg)   BMI 28.48 kg/m   Constitutional:  Alert and oriented, No acute distress. HEENT: [place] AT Respiratory: Normal respiratory effort, no increased work of breathing. Psychiatric: Normal mood and affect.   Assessment & Plan:    1.  T1c intermediate risk prostate cancer Status post IMRT +18 months ADT Stable PSA Follow-up 1 year with PSA.  I have reviewed the above documentation for accuracy and completeness, and I agree with the above.   Riki Altes, MD  [facility] [address] [place], [place] [address] [phone]

## 2024-06-07 ENCOUNTER — Encounter: Payer: Self-pay | Admitting: Urology

## 2024-08-15 ENCOUNTER — Other Ambulatory Visit: Payer: Managed Care, Other (non HMO)

## 2024-08-15 ENCOUNTER — Other Ambulatory Visit

## 2024-08-17 ENCOUNTER — Ambulatory Visit: Admitting: Urology

## 2024-08-17 ENCOUNTER — Ambulatory Visit: Payer: Managed Care, Other (non HMO) | Admitting: Urology

## 2024-08-28 ENCOUNTER — Ambulatory Visit: Admitting: Urology

## 2024-09-03 ENCOUNTER — Other Ambulatory Visit

## 2024-09-07 ENCOUNTER — Ambulatory Visit: Admitting: Urology
